# Patient Record
Sex: Male | Born: 1980 | Race: Black or African American | Hispanic: No | Marital: Married | Smoking: Never smoker
Health system: Southern US, Community
[De-identification: ages and names within clinical notes are randomized; demographics above are authoritative.]

## PROBLEM LIST (undated history)

## (undated) DIAGNOSIS — E785 Hyperlipidemia, unspecified: Secondary | ICD-10-CM

## (undated) HISTORY — DX: Hyperlipidemia, unspecified: E78.5

## (undated) HISTORY — PX: FOOT SURGERY: SHX648

## (undated) HISTORY — PX: OTHER SURGICAL HISTORY: SHX169

---

## 2010-05-23 ENCOUNTER — Ambulatory Visit: Payer: Self-pay | Admitting: Psychology

## 2011-07-18 ENCOUNTER — Other Ambulatory Visit (INDEPENDENT_AMBULATORY_CARE_PROVIDER_SITE_OTHER): Payer: BC Managed Care – PPO

## 2011-07-18 ENCOUNTER — Ambulatory Visit (INDEPENDENT_AMBULATORY_CARE_PROVIDER_SITE_OTHER): Payer: BC Managed Care – PPO | Admitting: Internal Medicine

## 2011-07-18 ENCOUNTER — Encounter: Payer: Self-pay | Admitting: Internal Medicine

## 2011-07-18 VITALS — BP 112/68 | HR 66 | Temp 97.8°F | Ht 69.0 in | Wt 173.0 lb

## 2011-07-18 DIAGNOSIS — R10819 Abdominal tenderness, unspecified site: Secondary | ICD-10-CM | POA: Insufficient documentation

## 2011-07-18 DIAGNOSIS — Z23 Encounter for immunization: Secondary | ICD-10-CM

## 2011-07-18 LAB — CBC WITH DIFFERENTIAL/PLATELET
Eosinophils Relative: 3 % (ref 0.0–5.0)
HCT: 41.1 % (ref 39.0–52.0)
Hemoglobin: 13.9 g/dL (ref 13.0–17.0)
Lymphs Abs: 1.8 10*3/uL (ref 0.7–4.0)
Monocytes Relative: 11.2 % (ref 3.0–12.0)
Neutro Abs: 2.5 10*3/uL (ref 1.4–7.7)
WBC: 5.1 10*3/uL (ref 4.5–10.5)

## 2011-07-18 LAB — COMPREHENSIVE METABOLIC PANEL
ALT: 14 U/L (ref 0–53)
AST: 16 U/L (ref 0–37)
Albumin: 4.5 g/dL (ref 3.5–5.2)
CO2: 29 mEq/L (ref 19–32)
Calcium: 9.3 mg/dL (ref 8.4–10.5)
Chloride: 104 mEq/L (ref 96–112)
GFR: 105.3 mL/min (ref >60.00)
Potassium: 3.7 mEq/L (ref 3.5–5.1)
Total Protein: 6.8 g/dL (ref 6.0–8.3)

## 2011-07-18 LAB — LIPID PANEL
Cholesterol: 165 mg/dL (ref 0–200)
HDL: 45.2 mg/dL (ref >39.00)
LDL Cholesterol: 112 mg/dL — ABNORMAL HIGH (ref 0–99)
Total CHOL/HDL Ratio: 4
Triglycerides: 38 mg/dL (ref 0.0–149.0)
VLDL: 7.6 mg/dL (ref 0.0–40.0)

## 2011-07-18 LAB — URINALYSIS, ROUTINE W REFLEX MICROSCOPIC
Bilirubin Urine: NEGATIVE
Ketones, ur: NEGATIVE
Leukocytes, UA: NEGATIVE
Specific Gravity, Urine: 1.02 (ref 1.000–1.030)
Total Protein, Urine: NEGATIVE
Urine Glucose: NEGATIVE
pH: 7.5 (ref 5.0–8.0)

## 2011-07-18 LAB — SEDIMENTATION RATE: Sed Rate: 3 mm/hr (ref 0–22)

## 2011-07-18 NOTE — Patient Instructions (Signed)
Abdominal Pain Abdominal pain can be caused by many things. Your caregiver decides the seriousness of your pain by an examination and possibly blood tests and X-rays. Many cases can be observed and treated at home. Most abdominal pain is not caused by a disease and will probably improve without treatment. However, in many cases, more time must pass before a clear cause of the pain can be found. Before that point, it may not be known if you need more testing, or if hospitalization or surgery is needed. HOME CARE INSTRUCTIONS  Do not take laxatives unless directed by your caregiver.   Take pain medicine only as directed by your caregiver.   Only take over-the-counter or prescription medicines for pain, discomfort, or fever as directed by your caregiver.   Try a clear liquid diet (broth, tea, or water) for  or as ordered by your caregiver. Slowly move to a bland diet as tolerated.  SEEK IMMEDIATE MEDICAL CARE IF:  The pain does not go away.   You or your child has an oral temperature above 100.5, not controlled by medicine.   You keep throwing up (vomiting).   The pain is felt only in portions of the abdomen. Pain in the right side could possibly be appendicitis. In an adult, pain in the left lower portion of the abdomen could be colitis or diverticulitis.   You pass bloody or black tarry stools.  MAKE SURE YOU:  Understand these instructions.   Will watch your condition.   Will get help right away if you are not doing well or get worse.  Document Released: 08/20/2005 Document Re-Released: 02/04/2010 [employer] Patient Information 2011 [employer]. 

## 2011-07-18 NOTE — Assessment & Plan Note (Signed)
He has had dull pain for 6 months and has mild ttp today, I will start the evaluation today by checking some labs

## 2011-07-18 NOTE — Progress Notes (Signed)
Subjective:    Patient ID: Kevin Frazier, male    DOB: February 06, 1981, 30 y.o.   MRN: [id number]  Abdominal Pain This is a chronic problem. Episode onset: 6 months ago. The onset quality is gradual. The problem occurs intermittently. The most recent episode lasted 6 months. The problem has been unchanged. The pain is located in the left flank. The pain is at a severity of 1/10. The pain is mild. The quality of the pain is aching and dull. The abdominal pain does not radiate. Pertinent negatives include no anorexia, arthralgias, belching, constipation, diarrhea, dysuria, fever, flatus, frequency, headaches, hematochezia, hematuria, melena, myalgias, nausea, vomiting or weight loss. The pain is aggravated by nothing. The pain is relieved by nothing. He has tried nothing for the symptoms. There is no history of abdominal surgery, colon cancer, Crohn's disease, gallstones, GERD, irritable bowel syndrome, pancreatitis, PUD or ulcerative colitis.      Review of Systems  Constitutional: Negative for fever, chills, weight loss, diaphoresis, activity change, appetite change, fatigue and unexpected weight change.  HENT: Negative.   Eyes: Negative.   Respiratory: Negative.   Cardiovascular: Negative.   Gastrointestinal: Positive for abdominal pain. Negative for nausea, vomiting, diarrhea, constipation, blood in stool, melena, hematochezia, abdominal distention, anal bleeding, rectal pain, anorexia and flatus.  Genitourinary: Positive for flank pain. Negative for dysuria, urgency, frequency, hematuria, decreased urine volume, discharge, penile swelling, scrotal swelling, enuresis, difficulty urinating, genital sores, penile pain and testicular pain.  Musculoskeletal: Negative for myalgias, back pain, joint swelling, arthralgias and gait problem.  Skin: Negative for color change, pallor, rash and wound.  Neurological: Negative.  Negative for headaches.  Hematological: Negative for adenopathy. Does not  bruise/bleed easily.  Psychiatric/Behavioral: Negative.        Objective:   Physical Exam  Vitals reviewed. Constitutional: He is oriented to person, place, and time. He appears well-developed and well-nourished. No distress.  HENT:  Head: Normocephalic and atraumatic.  Mouth/Throat: No oropharyngeal exudate.  Eyes: Conjunctivae are normal. Right eye exhibits no discharge. Left eye exhibits no discharge. No scleral icterus.  Neck: Normal range of motion. Neck supple. No JVD present. No tracheal deviation present. No thyromegaly present.  Cardiovascular: Normal rate, regular rhythm, normal heart sounds and intact distal pulses.  Exam reveals no gallop and no friction rub.   No murmur heard. Pulmonary/Chest: Effort normal and breath sounds normal. No stridor. No respiratory distress. He has no wheezes. He has no rales. He exhibits no tenderness.  Abdominal: Soft. Normal appearance and bowel sounds are normal. He exhibits no shifting dullness, no distension, no pulsatile liver, no fluid wave, no abdominal bruit, no ascites, no pulsatile midline mass and no mass. There is no hepatosplenomegaly, splenomegaly or hepatomegaly. There is tenderness (left mid-abdomen). There is no rebound, no guarding, no CVA tenderness, no tenderness at McBurney's point and negative Murphy's sign. No hernia. Hernia confirmed negative in the ventral area, confirmed negative in the right inguinal area and confirmed negative in the left inguinal area.  Genitourinary: Rectum normal, prostate normal, testes normal and penis normal. Rectal exam shows no external hemorrhoid, no internal hemorrhoid, no fissure, no mass, no tenderness and anal tone normal. Guaiac negative stool. Prostate is not enlarged and not tender. Right testis shows no mass, no swelling and no tenderness. Right testis is descended. Cremasteric reflex is not absent on the right side. Left testis shows no mass, no swelling and no tenderness. Left testis is  descended. Cremasteric reflex is not absent on the left side.  Circumcised. No penile erythema or penile tenderness. No discharge found.  Musculoskeletal: Normal range of motion. He exhibits no edema and no tenderness.  Lymphadenopathy:    He has no cervical adenopathy.       Right: No inguinal adenopathy present.       Left: No inguinal adenopathy present.  Neurological: He is oriented to person, place, and time. He has normal reflexes. He displays normal reflexes. No cranial nerve deficit. He exhibits normal muscle tone. Coordination normal.  Skin: Skin is warm and dry. No rash noted. He is not diaphoretic. No erythema. No pallor.  Psychiatric: He has a normal mood and affect. His behavior is normal. Judgment and thought content normal.          Assessment & Plan:

## 2011-07-20 ENCOUNTER — Encounter: Payer: Self-pay | Admitting: Internal Medicine

## 2013-08-26 ENCOUNTER — Encounter: Payer: Self-pay | Admitting: Internal Medicine

## 2013-08-26 ENCOUNTER — Ambulatory Visit (INDEPENDENT_AMBULATORY_CARE_PROVIDER_SITE_OTHER): Payer: 59 | Admitting: Internal Medicine

## 2013-08-26 VITALS — BP 138/82 | HR 73 | Temp 97.6°F | Resp 16 | Ht 69.0 in | Wt 187.0 lb

## 2013-08-26 DIAGNOSIS — Z0000 Encounter for general adult medical examination without abnormal findings: Secondary | ICD-10-CM

## 2013-08-26 DIAGNOSIS — Z23 Encounter for immunization: Secondary | ICD-10-CM

## 2013-08-26 NOTE — Patient Instructions (Signed)
Health Maintenance, Males A healthy lifestyle and preventative care can promote health and wellness.  Maintain regular health, dental, and eye exams.  Eat a healthy diet. Foods like vegetables, fruits, whole grains, low-fat dairy products, and lean protein foods contain the nutrients you need without too many calories. Decrease your intake of foods high in solid fats, added sugars, and salt. Get information about a proper diet from your caregiver, if necessary.  Regular physical exercise is one of the most important things you can do for your health. Most adults should get at least 150 minutes of moderate-intensity exercise (any activity that increases your heart rate and causes you to sweat) each week. In addition, most adults need muscle-strengthening exercises on 2 or more days a week.   Maintain a healthy weight. The body mass index (BMI) is a screening tool to identify possible weight problems. It provides an estimate of body fat based on height and weight. Your caregiver can help determine your BMI, and can help you achieve or maintain a healthy weight. For adults 20 years and older:  A BMI below 18.5 is considered underweight.  A BMI of 18.5 to 24.9 is normal.  A BMI of 25 to 29.9 is considered overweight.  A BMI of 30 and above is considered obese.  Maintain normal blood lipids and cholesterol by exercising and minimizing your intake of saturated fat. Eat a balanced diet with plenty of fruits and vegetables. Blood tests for lipids and cholesterol should begin at age 20 and be repeated every 5 years. If your lipid or cholesterol levels are high, you are over 50, or you are a high risk for heart disease, you may need your cholesterol levels checked more frequently.Ongoing high lipid and cholesterol levels should be treated with medicines, if diet and exercise are not effective.  If you smoke, find out from your caregiver how to quit. If you do not use tobacco, do not start.  If you  choose to drink alcohol, do not exceed 2 drinks per day. One drink is considered to be 12 ounces (355 mL) of beer, 5 ounces (148 mL) of wine, or 1.5 ounces (44 mL) of liquor.  Avoid use of street drugs. Do not share needles with anyone. Ask for help if you need support or instructions about stopping the use of drugs.  High blood pressure causes heart disease and increases the risk of stroke. Blood pressure should be checked at least every 1 to 2 years. Ongoing high blood pressure should be treated with medicines if weight loss and exercise are not effective.  If you are 45 to 32 years old, ask your caregiver if you should take aspirin to prevent heart disease.  Diabetes screening involves taking a blood sample to check your fasting blood sugar level. This should be done once every 3 years, after age 45, if you are within normal weight and without risk factors for diabetes. Testing should be considered at a younger age or be carried out more frequently if you are overweight and have at least 1 risk factor for diabetes.  Colorectal cancer can be detected and often prevented. Most routine colorectal cancer screening begins at the age of 50 and continues through age 75. However, your caregiver may recommend screening at an earlier age if you have risk factors for colon cancer. On a yearly basis, your caregiver may provide home test kits to check for hidden blood in the stool. Use of a small camera at the end of a tube,   to directly examine the colon (sigmoidoscopy or colonoscopy), can detect the earliest forms of colorectal cancer. Talk to your caregiver about this at age 50, when routine screening begins. Direct examination of the colon should be repeated every 5 to 10 years through age 75, unless early forms of pre-cancerous polyps or small growths are found.  Hepatitis C blood testing is recommended for all people born from 1945 through 1965 and any individual with known risks for hepatitis C.  Healthy  men should no longer receive prostate-specific antigen (PSA) blood tests as part of routine cancer screening. Consult with your caregiver about prostate cancer screening.  Testicular cancer screening is not recommended for adolescents or adult males who have no symptoms. Screening includes self-exam, caregiver exam, and other screening tests. Consult with your caregiver about any symptoms you have or any concerns you have about testicular cancer.  Practice safe sex. Use condoms and avoid high-risk sexual practices to reduce the spread of sexually transmitted infections (STIs).  Use sunscreen with a sun protection factor (SPF) of 30 or greater. Apply sunscreen liberally and repeatedly throughout the day. You should seek shade when your shadow is shorter than you. Protect yourself by wearing long sleeves, pants, a wide-brimmed hat, and sunglasses year round, whenever you are outdoors.  Notify your caregiver of new moles or changes in moles, especially if there is a change in shape or color. Also notify your caregiver if a mole is larger than the size of a pencil eraser.  A one-time screening for abdominal aortic aneurysm (AAA) and surgical repair of large AAAs by sound wave imaging (ultrasonography) is recommended for ages 65 to 75 years who are current or former smokers.  Stay current with your immunizations. Document Released: 05/08/2008 Document Revised: 02/02/2012 Document Reviewed: 04/07/2011 [employer] Patient Information 2014 [employer].  

## 2013-08-26 NOTE — Progress Notes (Signed)
  Subjective:    Patient ID: Kevin Frazier, male    DOB: [birth year], 32 y.o.   MRN: [id number]  HPI  He returns for a physical - he requests that he receive a booster for Tdap and MMR and that forms be completed for him to enroll at [facility]. He feels well and offers no complaints.  Review of Systems  All other systems reviewed and are negative.       Objective:   Physical Exam  Vitals reviewed. Constitutional: He is oriented to person, place, and time. He appears well-developed and well-nourished. No distress.  HENT:  Head: Normocephalic and atraumatic.  Mouth/Throat: Oropharynx is clear and moist. No oropharyngeal exudate.  Eyes: Conjunctivae are normal. Right eye exhibits no discharge. Left eye exhibits no discharge. No scleral icterus.  Neck: Normal range of motion. Neck supple. No JVD present. No tracheal deviation present. No thyromegaly present.  Cardiovascular: Normal rate, regular rhythm, normal heart sounds and intact distal pulses.  Exam reveals no gallop and no friction rub.   No murmur heard. Pulmonary/Chest: Effort normal and breath sounds normal. No stridor. No respiratory distress. He has no wheezes. He has no rales. He exhibits no tenderness.  Abdominal: Soft. Bowel sounds are normal. He exhibits no distension and no mass. There is no tenderness. There is no rebound and no guarding. Hernia confirmed negative in the right inguinal area and confirmed negative in the left inguinal area.  Genitourinary: Testes normal and penis normal. Right testis shows no mass, no swelling and no tenderness. Right testis is descended. Left testis shows no mass, no swelling and no tenderness. Left testis is descended. Circumcised. No penile erythema or penile tenderness. No discharge found.  Musculoskeletal: Normal range of motion. He exhibits no edema.  Lymphadenopathy:    He has no cervical adenopathy.       Right: No inguinal adenopathy present.       Left: No inguinal adenopathy present.   Neurological: He is oriented to person, place, and time.  Skin: Skin is warm and dry. No rash noted. He is not diaphoretic. No erythema. No pallor.  Psychiatric: He has a normal mood and affect. His behavior is normal. Judgment and thought content normal.     Lab Results  Component Value Date   WBC 5.1 07/18/2011   HGB 13.9 07/18/2011   HCT 41.1 07/18/2011   PLT 170.0 07/18/2011   GLUCOSE 104* 07/18/2011   CHOL 165 07/18/2011   TRIG 38.0 07/18/2011   HDL 45.20 07/18/2011   LDLCALC 112* 07/18/2011   ALT 14 07/18/2011   AST 16 07/18/2011   NA 139 07/18/2011   K 3.7 07/18/2011   CL 104 07/18/2011   CREATININE 0.9 07/18/2011   BUN 16 07/18/2011   CO2 29 07/18/2011   TSH 1.51 07/18/2011       Assessment & Plan:

## 2013-08-26 NOTE — Assessment & Plan Note (Signed)
Exam done Vaccines were reviewed and updated Labs ordered Form completed Pt ed material was given

## 2013-10-16 ENCOUNTER — Encounter (HOSPITAL_COMMUNITY): Payer: Self-pay | Admitting: Emergency Medicine

## 2013-10-16 ENCOUNTER — Emergency Department (HOSPITAL_COMMUNITY)
Admission: EM | Admit: 2013-10-16 | Discharge: 2013-10-16 | Disposition: A | Discharge status: Home / Self Care | Payer: 59 | Source: Home / Self Care | Attending: Emergency Medicine | Admitting: Emergency Medicine

## 2013-10-16 DIAGNOSIS — S99921A Unspecified injury of right foot, initial encounter: Secondary | ICD-10-CM

## 2013-10-16 DIAGNOSIS — S99919A Unspecified injury of unspecified ankle, initial encounter: Secondary | ICD-10-CM

## 2013-10-16 DIAGNOSIS — B351 Tinea unguium: Secondary | ICD-10-CM

## 2013-10-16 DIAGNOSIS — S8990XA Unspecified injury of unspecified lower leg, initial encounter: Secondary | ICD-10-CM

## 2013-10-16 MED ORDER — TERBINAFINE HCL 250 MG PO TABS: 250.0000 mg | ORAL_TABLET | Freq: Every day | ORAL | Status: DC

## 2013-10-16 MED ORDER — SULFAMETHOXAZOLE-TMP DS 800-160 MG PO TABS: 1.0000 | ORAL_TABLET | Freq: Two times a day (BID) | ORAL | Status: DC

## 2013-10-16 NOTE — ED Provider Notes (Signed)
Medical screening examination/treatment/procedure(s) were performed by non-physician practitioner and as supervising physician I was immediately available for consultation/collaboration.  Leslee Home, M.D.  Reuben Likes, MD 10/16/13 [phone]

## 2013-10-16 NOTE — ED Notes (Signed)
32 yr old is here with complaints Right Big toe toenail partially off x today. He states toenail was infected for 5 months and he was treating it with OTC anti-fungal medication. No other complaints.

## 2013-10-16 NOTE — ED Provider Notes (Signed)
CSN: [id number]     Arrival date & time 10/16/13  1629 History   First MD Initiated Contact with Patient 10/16/13 1829     Chief Complaint  Patient presents with  . Nail Problem   (Consider location/radiation/quality/duration/timing/severity/associated sxs/prior Treatment) HPI Comments: 32 year old male presents complaining of injury to his right great toenail. For the past 5 months, he has been treating onychomycosis with a topical antifungal nail paint daily. Today, he caught his toenail on something and it ripped the toenail up. He has some bleeding around the toenail. The toe is moderately painful as well. He denies any other symptoms or any other injuries.   History reviewed. No pertinent past medical history. History reviewed. No pertinent past surgical history. Family History  Problem Relation Age of Onset  . Cancer Neg Hx   . Diabetes Neg Hx   . Heart disease Neg Hx   . Hyperlipidemia Neg Hx   . Hypertension Neg Hx   . Kidney disease Neg Hx    History  Substance Use Topics  . Smoking status: Never Smoker   . Smokeless tobacco: Never Used  . Alcohol Use: 1.2 oz/week    2 Shots of liquor per week     Comment: occassional    Review of Systems  Constitutional: Negative for fever, chills and fatigue.  HENT: Negative for sore throat.   Eyes: Negative for visual disturbance.  Respiratory: Negative for cough and shortness of breath.   Cardiovascular: Negative for chest pain, palpitations and leg swelling.  Gastrointestinal: Negative for nausea, vomiting, abdominal pain, diarrhea and constipation.  Genitourinary: Negative for dysuria, urgency, frequency and hematuria.  Musculoskeletal:       See history of present illness  Skin: Negative for rash.  Neurological: Negative for dizziness, weakness and light-headedness.    Allergies  Review of patient's allergies indicates no known allergies.  Home Medications   Current Outpatient Rx  Name  Route  Sig  Dispense  Refill    . sulfamethoxazole-trimethoprim (BACTRIM DS) 800-160 MG per tablet   Oral   Take 1 tablet by mouth 2 (two) times daily.   14 tablet   0   . terbinafine (LAMISIL) 250 MG tablet   Oral   Take 1 tablet (250 mg total) by mouth daily.   30 tablet   2    BP 134/84  Pulse 60  Temp(Src) 97.9 F (36.6 C) (Oral)  Resp 16  SpO2 100% Physical Exam  Nursing note and vitals reviewed. Constitutional: He is oriented to person, place, and time. He appears well-developed and well-nourished. No distress.  HENT:  Head: Normocephalic.  Pulmonary/Chest: Effort normal. No respiratory distress.  Musculoskeletal:       Feet:  Neurological: He is alert and oriented to person, place, and time. Coordination normal.  Skin: Skin is warm and dry. No rash noted. He is not diaphoretic.  Psychiatric: He has a normal mood and affect. Judgment normal.    ED Course  Procedures (including critical care time) Labs Review Labs Reviewed - No data to display Imaging Review No results found.    MDM   1. Injury of toenail of right foot   2. Onychomycosis     The toenail was ripped up but it is still attached at the base, I will leave in place. Advised warm soaks 3 times a day and will start on oral Lamisil. Also provided a prescription for Bactrim if he should have increasing pain, redness, or discharge that may indicate secondary bacterial  infection. Otherwise do not take the Bactrim. Followup with your primary care provider  Meds ordered this encounter  Medications  . terbinafine (LAMISIL) 250 MG tablet    Sig: Take 1 tablet (250 mg total) by mouth daily.    Dispense:  30 tablet    Refill:  2    Order Specific Question:  Supervising Provider    Answer:  Lorenz Coaster, DAVID C [id number]  . sulfamethoxazole-trimethoprim (BACTRIM DS) 800-160 MG per tablet    Sig: Take 1 tablet by mouth 2 (two) times daily.    Dispense:  14 tablet    Refill:  0    Order Specific Question:  Supervising Provider    Answer:   Lorenz Coaster, DAVID C [[id number]]   \  Graylon Good, PA-C 10/16/13 1956

## 2013-12-29 ENCOUNTER — Ambulatory Visit (INDEPENDENT_AMBULATORY_CARE_PROVIDER_SITE_OTHER): Payer: 59 | Admitting: Internal Medicine

## 2013-12-29 ENCOUNTER — Ambulatory Visit: Payer: 59 | Admitting: Internal Medicine

## 2013-12-29 ENCOUNTER — Encounter: Payer: Self-pay | Admitting: Internal Medicine

## 2013-12-29 VITALS — BP 120/68 | HR 85 | Temp 97.0°F | Resp 16 | Ht 69.0 in

## 2013-12-29 DIAGNOSIS — B029 Zoster without complications: Secondary | ICD-10-CM | POA: Insufficient documentation

## 2013-12-29 MED ORDER — OXYCODONE-ACETAMINOPHEN 7.5-325 MG PO TABS: 1.0000 | ORAL_TABLET | ORAL | Status: DC | PRN

## 2013-12-29 MED ORDER — VALACYCLOVIR HCL 1 G PO TABS: 1000.0000 mg | ORAL_TABLET | Freq: Three times a day (TID) | ORAL | Status: DC

## 2013-12-29 NOTE — Assessment & Plan Note (Signed)
I will treat the infection with valtrex and will control the pain with percocet

## 2013-12-29 NOTE — Progress Notes (Signed)
Pre visit review using our clinic review tool, if applicable. No additional management support is needed unless otherwise documented below in the visit note. 

## 2013-12-29 NOTE — Progress Notes (Signed)
   Subjective:    Patient ID: Kevin Frazier, male    DOB: Jan 03, 1981, 33 y.o.   MRN: [id number]  Rash This is a new problem. The current episode started in the past 7 days. The problem is unchanged. The affected locations include the torso. The rash is characterized by pain, redness and blistering. He was exposed to nothing. Pertinent negatives include no anorexia, congestion, cough, diarrhea, eye pain, facial edema, fatigue, fever, joint pain, nail changes, rhinorrhea, shortness of breath, sore throat or vomiting. Past treatments include nothing. His past medical history is significant for varicella. There is no history of allergies, asthma or eczema.      Review of Systems  Constitutional: Negative for fever and fatigue.  HENT: Negative for congestion, rhinorrhea and sore throat.   Eyes: Negative for pain.  Respiratory: Negative for cough and shortness of breath.   Gastrointestinal: Negative for vomiting, diarrhea and anorexia.  Musculoskeletal: Negative for joint pain.  Skin: Positive for rash. Negative for nail changes.  All other systems reviewed and are negative.       Objective:   Physical Exam  Vitals reviewed. Constitutional: He appears well-developed and well-nourished.  Non-toxic appearance. He does not have a sickly appearance. He does not appear ill. No distress.  HENT:  Head: Normocephalic and atraumatic.  Mouth/Throat: Oropharynx is clear and moist. No oropharyngeal exudate.  Eyes: Conjunctivae are normal. Right eye exhibits no discharge. Left eye exhibits no discharge. No scleral icterus.  Neck: Normal range of motion. Neck supple. No JVD present. No tracheal deviation present. No thyromegaly present.  Cardiovascular: Normal rate, regular rhythm, normal heart sounds and intact distal pulses.  Exam reveals no gallop and no friction rub.   No murmur heard. Pulmonary/Chest: Effort normal and breath sounds normal. No stridor. No respiratory distress. He has no wheezes. He  has no rales.   He exhibits no tenderness.    Abdominal: Soft. Bowel sounds are normal. He exhibits no distension and no mass. There is no tenderness. There is no rebound and no guarding.  Lymphadenopathy:       Head (right side): No submental, no submandibular, no tonsillar, no preauricular, no posterior auricular and no occipital adenopathy present.       Head (left side): No submental, no submandibular, no tonsillar, no preauricular, no posterior auricular and no occipital adenopathy present.    He has no cervical adenopathy.       Right cervical: No superficial cervical, no deep cervical and no posterior cervical adenopathy present.      Left cervical: No superficial cervical, no deep cervical and no posterior cervical adenopathy present.    He has no axillary adenopathy.       Right: No inguinal, no supraclavicular and no epitrochlear adenopathy present.       Left: No inguinal, no supraclavicular and no epitrochlear adenopathy present.  Skin: Skin is warm and dry. Rash noted. He is not diaphoretic. There is erythema. No pallor.  Psychiatric: He has a normal mood and affect. His behavior is normal. Judgment and thought content normal.          Assessment & Plan:

## 2013-12-29 NOTE — Patient Instructions (Signed)
Shingles Shingles (herpes zoster) is an infection that is caused by the same virus that causes chickenpox (varicella). The infection causes a painful skin rash and fluid-filled blisters, which eventually break open, crust over, and heal. It may occur in any area of the body, but it usually affects only one side of the body or face. The pain of shingles usually lasts about 1 month. However, some people with shingles may develop long-term (chronic) pain in the affected area of the body. Shingles often occurs many years after the person had chickenpox. It is more common:  In people older than 50 years.  In people with weakened immune systems, such as those with HIV, AIDS, or cancer.  In people taking medicines that weaken the immune system, such as transplant medicines.  In people under great stress. CAUSES  Shingles is caused by the varicella zoster virus (VZV), which also causes chickenpox. After a person is infected with the virus, it can remain in the person's body for years in an inactive state (dormant). To cause shingles, the virus reactivates and breaks out as an infection in a nerve root. The virus can be spread from person to person (contagious) through contact with open blisters of the shingles rash. It will only spread to people who have not had chickenpox. When these people are exposed to the virus, they may develop chickenpox. They will not develop shingles. Once the blisters scab over, the person is no longer contagious and cannot spread the virus to others. SYMPTOMS  Shingles shows up in stages. The initial symptoms may be pain, itching, and tingling in an area of the skin. This pain is usually described as burning, stabbing, or throbbing.In a few days or weeks, a painful red rash will appear in the area where the pain, itching, and tingling were felt. The rash is usually on one side of the body in a band or belt-like pattern. Then, the rash usually turns into fluid-filled blisters. They  will scab over and dry up in approximately 2 3 weeks. Flu-like symptoms may also occur with the initial symptoms, the rash, or the blisters. These may include:  Fever.  Chills.  Headache.  Upset stomach. DIAGNOSIS  Your caregiver will perform a skin exam to diagnose shingles. Skin scrapings or fluid samples may also be taken from the blisters. This sample will be examined under a microscope or sent to a lab for further testing. TREATMENT  There is no specific cure for shingles. Your caregiver will likely prescribe medicines to help you manage the pain, recover faster, and avoid long-term problems. This may include antiviral drugs, anti-inflammatory drugs, and pain medicines. HOME CARE INSTRUCTIONS   Take a cool bath or apply cool compresses to the area of the rash or blisters as directed. This may help with the pain and itching.   Only take over-the-counter or prescription medicines as directed by your caregiver.   Rest as directed by your caregiver.  Keep your rash and blisters clean with mild soap and cool water or as directed by your caregiver.  Do not pick your blisters or scratch your rash. Apply an anti-itch cream or numbing creams to the affected area as directed by your caregiver.  Keep your shingles rash covered with a loose bandage (dressing).  Avoid skin contact with:  Babies.   Pregnant women.   Children with eczema.   Elderly people with transplants.   People with chronic illnesses, such as leukemia or AIDS.   Wear loose-fitting clothing to help ease   the pain of material rubbing against the rash.  Keep all follow-up appointments with your caregiver.If the area involved is on your face, you may receive a referral for follow-up to a specialist, such as an eye doctor (ophthalmologist) or an ear, nose, and throat (ENT) doctor. Keeping all follow-up appointments will help you avoid eye complications, chronic pain, or disability.  SEEK IMMEDIATE MEDICAL  CARE IF:   You have facial pain, pain around the eye area, or loss of feeling on one side of your face.  You have ear pain or ringing in your ear.  You have loss of taste.  Your pain is not relieved with prescribed medicines.   Your redness or swelling spreads.   You have more pain and swelling.  Your condition is worsening or has changed.   You have a feveror persistent symptoms for more than 2 3 days.  You have a fever and your symptoms suddenly get worse. MAKE SURE YOU:  Understand these instructions.  Will watch your condition.  Will get help right away if you are not doing well or get worse. Document Released: 11/10/2005 Document Revised: 08/04/2012 Document Reviewed: 06/24/2012 [employer] Patient Information 2014 [employer].  

## 2015-12-05 ENCOUNTER — Encounter: Payer: Self-pay | Admitting: Internal Medicine

## 2015-12-05 ENCOUNTER — Ambulatory Visit (INDEPENDENT_AMBULATORY_CARE_PROVIDER_SITE_OTHER): Payer: 59 | Admitting: Internal Medicine

## 2015-12-05 ENCOUNTER — Other Ambulatory Visit: Payer: 59

## 2015-12-05 ENCOUNTER — Other Ambulatory Visit (INDEPENDENT_AMBULATORY_CARE_PROVIDER_SITE_OTHER): Payer: 59

## 2015-12-05 VITALS — BP 100/64 | HR 75 | Temp 98.1°F | Ht 70.0 in | Wt 180.0 lb

## 2015-12-05 DIAGNOSIS — R1084 Generalized abdominal pain: Secondary | ICD-10-CM | POA: Diagnosis not present

## 2015-12-05 DIAGNOSIS — R197 Diarrhea, unspecified: Secondary | ICD-10-CM

## 2015-12-05 DIAGNOSIS — R109 Unspecified abdominal pain: Secondary | ICD-10-CM | POA: Insufficient documentation

## 2015-12-05 LAB — BASIC METABOLIC PANEL
BUN: 15 mg/dL (ref 6–23)
CHLORIDE: 104 meq/L (ref 96–112)
CO2: 26 mEq/L (ref 19–32)
Calcium: 9.3 mg/dL (ref 8.4–10.5)
Creatinine, Ser: 0.82 mg/dL (ref 0.40–1.50)
GFR: 114.05 mL/min (ref >60.00)
Glucose, Bld: 80 mg/dL (ref 70–99)
POTASSIUM: 4.1 meq/L (ref 3.5–5.1)
Sodium: 137 mEq/L (ref 135–145)

## 2015-12-05 LAB — CBC WITH DIFFERENTIAL/PLATELET
BASOS PCT: 0.2 % (ref 0.0–3.0)
Basophils Absolute: 0 10*3/uL (ref 0.0–0.1)
EOS PCT: 4.8 % (ref 0.0–5.0)
Eosinophils Absolute: 0.3 10*3/uL (ref 0.0–0.7)
HEMATOCRIT: 45.6 % (ref 39.0–52.0)
HEMOGLOBIN: 15.2 g/dL (ref 13.0–17.0)
Lymphocytes Relative: 19.5 % (ref 12.0–46.0)
Lymphs Abs: 1.4 10*3/uL (ref 0.7–4.0)
MCHC: 33.4 g/dL (ref 30.0–36.0)
MCV: 87.8 fl (ref 78.0–100.0)
Monocytes Absolute: 1.1 10*3/uL — ABNORMAL HIGH (ref 0.1–1.0)
Monocytes Relative: 15.7 % — ABNORMAL HIGH (ref 3.0–12.0)
NEUTROS ABS: 4.3 10*3/uL (ref 1.4–7.7)
Neutrophils Relative %: 59.8 % (ref 43.0–77.0)
PLATELETS: 228 10*3/uL (ref 150.0–400.0)
RBC: 5.19 Mil/uL (ref 4.22–5.81)
RDW: 12.2 % (ref 11.5–15.5)
WBC: 7.2 10*3/uL (ref 4.0–10.5)

## 2015-12-05 LAB — URINALYSIS, ROUTINE W REFLEX MICROSCOPIC
Bilirubin Urine: NEGATIVE
Hgb urine dipstick: NEGATIVE
Ketones, ur: 15 — AB
Leukocytes, UA: NEGATIVE
Nitrite: NEGATIVE
PH: 6 (ref 5.0–8.0)
RBC / HPF: NONE SEEN (ref 0–?)
SPECIFIC GRAVITY, URINE: 1.015 (ref 1.000–1.030)
Total Protein, Urine: NEGATIVE
URINE GLUCOSE: NEGATIVE
UROBILINOGEN UA: 0.2 (ref 0.0–1.0)

## 2015-12-05 LAB — HEPATIC FUNCTION PANEL
ALT: 20 U/L (ref 0–53)
AST: 17 U/L (ref 0–37)
Albumin: 4.5 g/dL (ref 3.5–5.2)
Alkaline Phosphatase: 28 U/L — ABNORMAL LOW (ref 39–117)
BILIRUBIN DIRECT: 0.2 mg/dL (ref 0.0–0.3)
BILIRUBIN TOTAL: 1 mg/dL (ref 0.2–1.2)
Total Protein: 6.8 g/dL (ref 6.0–8.3)

## 2015-12-05 LAB — LIPASE: Lipase: 7 U/L — ABNORMAL LOW (ref 11.0–59.0)

## 2015-12-05 MED ORDER — METRONIDAZOLE 500 MG PO TABS: 500.0000 mg | ORAL_TABLET | Freq: Three times a day (TID) | ORAL | Status: DC

## 2015-12-05 MED ORDER — CIPROFLOXACIN HCL 500 MG PO TABS: 500.0000 mg | ORAL_TABLET | Freq: Two times a day (BID) | ORAL | Status: DC

## 2015-12-05 NOTE — Progress Notes (Signed)
Pre visit review using our clinic review tool, if applicable. No additional management support is needed unless otherwise documented below in the visit note. 

## 2015-12-05 NOTE — Progress Notes (Signed)
   Subjective:    Patient ID: Kevin Frazier, male    DOB: 11-13-1981, 35 y.o.   MRN: [id number]  HPI  Here with acute onset diarrheal illness, started 1 wk ago, seemed some improved a few days later, but last night started up again with mult liquid stools; wife with same diet but not ill.  [place] - 1 wk from dec to [place]; No other travel.or pets. No fever last night, but did have fever for first 2 days. Diarrhea severe today, though only drinking fluids, water and juice. Pain mod to severe, mostly mid abd possbily worse LLQ; No vomiting or nausea. Lots of gurgling sounds.  No blood. Pain without radation No prior hx.or hx of IBD or similar. Works at [employer], but not eaten there in the past wk. No c diff exposure known.  Stool smelled foul today No past medical history on file. No past surgical history on file.  reports that he has never smoked. He has never used smokeless tobacco. He reports that he drinks about 1.2 oz of alcohol per week. He reports that he does not use illicit drugs. family history is negative for Cancer, Diabetes, Heart disease, Hyperlipidemia, Hypertension, and Kidney disease. No Known Allergies Current Outpatient Prescriptions on File Prior to Visit  Medication Sig Dispense Refill  . oxyCODONE-acetaminophen (PERCOCET) 7.5-325 MG per tablet Take 1 tablet by mouth every 4 (four) hours as needed for pain. (Patient not taking: Reported on 12/05/2015) 30 tablet 0  . valACYclovir (VALTREX) 1000 MG tablet Take 1 tablet (1,000 mg total) by mouth 3 (three) times daily. (Patient not taking: Reported on 12/05/2015) 30 tablet 1   No current facility-administered medications on file prior to visit.    Review of Systems  Constitutional: Negative for unusual diaphoresis or night sweats HENT: Negative for ringing in ear or discharge Eyes: Negative for double vision or worsening visual disturbance.  Respiratory: Negative for choking and stridor.     Gastrointestinal: Negative for vomiting or other signifcant bowel change Genitourinary: Negative for hematuria or change in urine volume.  Musculoskeletal: Negative for other MSK pain or swelling Skin: Negative for color change and worsening wound.  Neurological: Negative for tremors and numbness other than noted  Psychiatric/Behavioral: Negative for decreased concentration or agitation other than above       Objective:   Physical Exam BP 100/64 mmHg  Pulse 75  Temp(Src) 98.1 F (36.7 C) (Oral)  Ht 5\' 10"  (1.778 m)  Wt 180 lb (81.647 kg)  BMI 25.83 kg/m2  SpO2 98% VS noted, mild ill Constitutional: Pt appears in no significant distress HENT: Head: NCAT.  Right Ear: External ear normal.  Left Ear: External ear normal.  Eyes: . Pupils are equal, round, and reactive to light. Conjunctivae and EOM are normal Bilat tm's with mild erythema.  Max sinus areas non tender.  Pharynx with mild erythema, no exudate Neck: Normal range of motion. Neck supple.  Cardiovascular: Normal rate and regular rhythm.   Pulmonary/Chest: Effort normal and breath sounds without rales or wheezing.  Abd:  Soft, ND, + BS, diffuse mild tender, no guarding or rebound Neurological: Pt is alert. Not confused , motor grossly intact Skin: Skin is warm. No rash, no LE edema Psychiatric: Pt behavior is normal. No agitation.     Assessment & Plan:

## 2015-12-05 NOTE — Patient Instructions (Addendum)
Please take all new medication as prescribed  - the antibiotics  Please continue all other medications as before, and refills have been done if requested.  Please have the pharmacy call with any other refills you may need.  Please keep your appointments with your specialists as you may have planned  Please go to the LAB in the Basement (turn left off the elevator) for the tests to be done today (including the stool testing)  You will be contacted by phone if any changes need to be made immediately.  Otherwise, you will receive a letter about your results with an explanation, but please check with MyChart first.  You are given the work note today

## 2015-12-06 LAB — CLOSTRIDIUM DIFFICILE BY PCR: Toxigenic C. Difficile by PCR: NOT DETECTED

## 2015-12-08 NOTE — Assessment & Plan Note (Addendum)
C/w colitis, ? Viral vs other, cant r/o bact/c diff, for labs as ordered,  Stool studies, empiric antibx,  to f/u any worsening symptoms or concerns

## 2015-12-08 NOTE — Assessment & Plan Note (Signed)
Related to above, for labs as ordered

## 2015-12-09 LAB — STOOL CULTURE

## 2016-08-27 ENCOUNTER — Ambulatory Visit (INDEPENDENT_AMBULATORY_CARE_PROVIDER_SITE_OTHER): Payer: 59 | Admitting: Internal Medicine

## 2016-08-27 ENCOUNTER — Other Ambulatory Visit (INDEPENDENT_AMBULATORY_CARE_PROVIDER_SITE_OTHER): Payer: 59

## 2016-08-27 ENCOUNTER — Ambulatory Visit (INDEPENDENT_AMBULATORY_CARE_PROVIDER_SITE_OTHER)
Admission: RE | Admit: 2016-08-27 | Discharge: 2016-08-27 | Disposition: A | Discharge status: Home / Self Care | Payer: 59 | Source: Ambulatory Visit | Attending: Internal Medicine | Admitting: Internal Medicine

## 2016-08-27 ENCOUNTER — Encounter: Payer: Self-pay | Admitting: Internal Medicine

## 2016-08-27 VITALS — BP 122/72 | HR 74 | Temp 98.2°F | Resp 20 | Wt 181.2 lb

## 2016-08-27 DIAGNOSIS — R079 Chest pain, unspecified: Secondary | ICD-10-CM

## 2016-08-27 DIAGNOSIS — R1012 Left upper quadrant pain: Secondary | ICD-10-CM

## 2016-08-27 DIAGNOSIS — R197 Diarrhea, unspecified: Secondary | ICD-10-CM

## 2016-08-27 DIAGNOSIS — K6289 Other specified diseases of anus and rectum: Secondary | ICD-10-CM | POA: Diagnosis not present

## 2016-08-27 DIAGNOSIS — Z0001 Encounter for general adult medical examination with abnormal findings: Secondary | ICD-10-CM | POA: Diagnosis not present

## 2016-08-27 LAB — URINALYSIS, ROUTINE W REFLEX MICROSCOPIC
BILIRUBIN URINE: NEGATIVE
Hgb urine dipstick: NEGATIVE
Ketones, ur: NEGATIVE
LEUKOCYTES UA: NEGATIVE
Nitrite: NEGATIVE
Specific Gravity, Urine: <=1.005 — AB (ref 1.000–1.030)
TOTAL PROTEIN, URINE-UPE24: NEGATIVE
Urine Glucose: NEGATIVE
Urobilinogen, UA: 0.2 (ref 0.0–1.0)
pH: 7 (ref 5.0–8.0)

## 2016-08-27 LAB — LIPID PANEL
CHOLESTEROL: 188 mg/dL (ref 0–200)
HDL: 48.9 mg/dL (ref >39.00)
LDL Cholesterol: 123 mg/dL — ABNORMAL HIGH (ref 0–99)
NonHDL: 139.51
Total CHOL/HDL Ratio: 4
Triglycerides: 85 mg/dL (ref 0.0–149.0)
VLDL: 17 mg/dL (ref 0.0–40.0)

## 2016-08-27 LAB — BASIC METABOLIC PANEL
BUN: 8 mg/dL (ref 6–23)
CALCIUM: 9.5 mg/dL (ref 8.4–10.5)
CHLORIDE: 103 meq/L (ref 96–112)
CO2: 29 meq/L (ref 19–32)
CREATININE: 0.82 mg/dL (ref 0.40–1.50)
GFR: 113.56 mL/min (ref >60.00)
GLUCOSE: 95 mg/dL (ref 70–99)
Potassium: 4 mEq/L (ref 3.5–5.1)
SODIUM: 139 meq/L (ref 135–145)

## 2016-08-27 LAB — CBC WITH DIFFERENTIAL/PLATELET
BASOS ABS: 0 10*3/uL (ref 0.0–0.1)
Basophils Relative: 0.5 % (ref 0.0–3.0)
Eosinophils Absolute: 0.2 10*3/uL (ref 0.0–0.7)
Eosinophils Relative: 2.6 % (ref 0.0–5.0)
HCT: 40 % (ref 39.0–52.0)
Hemoglobin: 13.9 g/dL (ref 13.0–17.0)
Lymphocytes Relative: 37.2 % (ref 12.0–46.0)
Lymphs Abs: 2.4 10*3/uL (ref 0.7–4.0)
MCHC: 34.8 g/dL (ref 30.0–36.0)
MCV: 87.6 fl (ref 78.0–100.0)
MONOS PCT: 11.2 % (ref 3.0–12.0)
Monocytes Absolute: 0.7 10*3/uL (ref 0.1–1.0)
NEUTROS PCT: 48.5 % (ref 43.0–77.0)
Neutro Abs: 3.2 10*3/uL (ref 1.4–7.7)
Platelets: 211 10*3/uL (ref 150.0–400.0)
RBC: 4.56 Mil/uL (ref 4.22–5.81)
RDW: 12.4 % (ref 11.5–15.5)
WBC: 6.6 10*3/uL (ref 4.0–10.5)

## 2016-08-27 LAB — HEPATIC FUNCTION PANEL
ALBUMIN: 4.4 g/dL (ref 3.5–5.2)
ALK PHOS: 24 U/L — AB (ref 39–117)
ALT: 13 U/L (ref 0–53)
AST: 13 U/L (ref 0–37)
Bilirubin, Direct: 0.1 mg/dL (ref 0.0–0.3)
TOTAL PROTEIN: 6.9 g/dL (ref 6.0–8.3)
Total Bilirubin: 0.8 mg/dL (ref 0.2–1.2)

## 2016-08-27 LAB — TSH: TSH: 1.65 u[IU]/mL (ref 0.35–4.50)

## 2016-08-27 NOTE — Patient Instructions (Addendum)
Please continue all other medications as before, and refills have been done if requested.  Please have the pharmacy call with any other refills you may need.  Please continue your efforts at being more active, low cholesterol diet, and weight control.  You are otherwise up to date with prevention measures today.  Please keep your appointments with your specialists as you may have planned  You will be contacted regarding the referral for: Gastroenterology  Please go to the XRAY Department in the Basement (go straight as you get off the elevator) for the x-ray testing  Please go to the LAB in the Basement (turn left off the elevator) for the tests to be done today  You will be contacted by phone if any changes need to be made immediately.  Otherwise, you will receive a letter about your results with an explanation, but please check with MyChart first.  Please remember to sign up for MyChart if you have not done so, as this will be important to you in the future with finding out test results, communicating by private email, and scheduling acute appointments online when needed.  Please return in 1 year for your yearly visit, or sooner if needed, with Lab testing done 3-5 days before

## 2016-08-27 NOTE — Progress Notes (Signed)
Subjective:    Patient ID: Kevin Frazier, male    DOB: Apr 13, 1981, 35 y.o.   MRN: [id number]  HPI  Here for wellness and f/u;  Overall doing ok;  Pt denies worsening SOB, DOE, wheezing, orthopnea, PND, worsening LE edema, palpitations, dizziness or syncope.  Pt denies neurological change such as new headache, facial or extremity weakness.  Pt denies polydipsia, polyuria, or low sugar symptoms. Pt states overall good compliance with treatment and medications, good tolerability, and has been trying to follow appropriate diet.  Pt denies worsening depressive symptoms, suicidal ideation or panic. No fever, night sweats, wt loss, loss of appetite, or other constitutional symptoms.  Pt states good ability with ADL's, has low fall risk, home safety reviewed and adequate, no other significant changes in hearing or vision, and only occasionally active with exercise. To get flu shot next wk at work.   Was seen for diarrhea earlier this yr, but still has some "irritated anal" and funny feeling to LUQ, not really a "pain". No further diarrhea or other significant pain. No wt loss, Appetite ok.  Denies worsening reflux, dysphagia, n/v, other bowel change or blood and no fever. Asks for Gi referral Wt Readings from Last 3 Encounters:  08/27/16 181 lb 4 oz (82.2 kg)  12/05/15 180 lb (81.6 kg)  08/26/13 187 lb (84.8 kg)  Denies urinary symptoms such as dysuria, frequency, urgency, flank pain, hematuria or n/v, fever, chills.  Has also left sided sharp intermittent fleeting pains for several days, without assoc symptoms or radiation. No past medical history on file. No past surgical history on file.  reports that he has never smoked. He has never used smokeless tobacco. He reports that he drinks about 1.2 oz of alcohol per week . He reports that he does not use drugs. family history is not on file. No Known Allergies Current Outpatient Prescriptions on File Prior to Visit  Medication Sig Dispense Refill  .  ciprofloxacin (CIPRO) 500 MG tablet Take 1 tablet (500 mg total) by mouth 2 (two) times daily. 20 tablet 0  . metroNIDAZOLE (FLAGYL) 500 MG tablet Take 1 tablet (500 mg total) by mouth 3 (three) times daily. 30 tablet 0  . oxyCODONE-acetaminophen (PERCOCET) 7.5-325 MG per tablet Take 1 tablet by mouth every 4 (four) hours as needed for pain. 30 tablet 0  . valACYclovir (VALTREX) 1000 MG tablet Take 1 tablet (1,000 mg total) by mouth 3 (three) times daily. 30 tablet 1   No current facility-administered medications on file prior to visit.      Review of Systems Constitutional: Negative for increased diaphoresis, or other activity, appetite or siginficant weight change other than noted HENT: Negative for worsening hearing loss, ear pain, facial swelling, mouth sores and neck stiffness.   Eyes: Negative for other worsening pain, redness or visual disturbance.  Respiratory: Negative for choking or stridor Cardiovascular: Negative for other chest pain and palpitations.  Gastrointestinal: Negative for worsening diarrhea, blood in stool, or abdominal distention Genitourinary: Negative for hematuria, flank pain or change in urine volume.  Musculoskeletal: Negative for myalgias or other joint complaints.  Skin: Negative for other color change and wound or drainage.  Neurological: Negative for syncope and numbness. other than noted Hematological: Negative for adenopathy. or other swelling Psychiatric/Behavioral: Negative for hallucinations, SI, self-injury, decreased concentration or other worsening agitation.      Objective:   Physical Exam BP 122/72   Pulse 74   Temp 98.2 F (36.8 C) (Oral)   Resp  20   Wt 181 lb 4 oz (82.2 kg)   SpO2 98%   BMI 26.01 kg/m  VS noted,  Constitutional: Pt is oriented to person, place, and time. Appears well-developed and well-nourished, in no significant distress Head: Normocephalic and atraumatic  Eyes: Conjunctivae and EOM are normal. Pupils are equal,  round, and reactive to light Right Ear: External ear normal.  Left Ear: External ear normal Nose: Nose normal.  Mouth/Throat: Oropharynx is clear and moist  Neck: Normal range of motion. Neck supple. No JVD present. No tracheal deviation present or significant neck LA or mass Cardiovascular: Normal rate, regular rhythm, normal heart sounds and intact distal pulses.   Pulmonary/Chest: Effort normal and breath sounds without rales or wheezing  Abdominal: Soft. Bowel sounds are normal. NT. No HSM  Musculoskeletal: Normal range of motion. Exhibits no edema but has tender bilat costal margins, also tender area left lateral chest wall anterior axillary line t5 Lymphadenopathy: Has no cervical adenopathy.  Neurological: Pt is alert and oriented to person, place, and time. Pt has normal reflexes. No cranial nerve deficit. Motor grossly intact Skin: Skin is warm and dry. No rash noted or new ulcers Psychiatric:  Has normal mood and affect. Behavior is normal.     Assessment & Plan:

## 2016-08-27 NOTE — Progress Notes (Signed)
Pre visit review using our clinic review tool, if applicable. No additional management support is needed unless otherwise documented below in the visit note. 

## 2016-08-30 DIAGNOSIS — R079 Chest pain, unspecified: Secondary | ICD-10-CM | POA: Insufficient documentation

## 2016-08-30 DIAGNOSIS — K6289 Other specified diseases of anus and rectum: Secondary | ICD-10-CM | POA: Insufficient documentation

## 2016-08-30 NOTE — Assessment & Plan Note (Signed)

## 2016-08-30 NOTE — Assessment & Plan Note (Addendum)
C/w probable msk pain, for cxr r/o other, to f/u any worsening symptoms or concerns, very low suspicion for cardiac

## 2016-08-30 NOTE — Assessment & Plan Note (Signed)
Post recent diarrheal illness, exam benign but discomfort persists, for labs, f/u with GI per pt request

## 2016-08-30 NOTE — Assessment & Plan Note (Signed)
Resolved,  to f/u any worsening symptoms or concerns  

## 2016-08-30 NOTE — Assessment & Plan Note (Addendum)
Mild, for GI referral per pt request,  to f/u any worsening symptoms or concerns  In addition to the time spent performing CPE, I spent an additional 15 minutes face to face,in which greater than 50% of this time was spent in counseling and coordination of care for patient's illness as documented.

## 2016-09-16 ENCOUNTER — Encounter: Payer: Self-pay | Admitting: Gastroenterology

## 2016-11-13 ENCOUNTER — Ambulatory Visit (INDEPENDENT_AMBULATORY_CARE_PROVIDER_SITE_OTHER): Payer: 59 | Admitting: Gastroenterology

## 2016-11-13 ENCOUNTER — Encounter: Payer: Self-pay | Admitting: Gastroenterology

## 2016-11-13 ENCOUNTER — Other Ambulatory Visit: Payer: 59

## 2016-11-13 VITALS — BP 98/70 | HR 80 | Ht 69.0 in | Wt 186.2 lb

## 2016-11-13 DIAGNOSIS — K6289 Other specified diseases of anus and rectum: Secondary | ICD-10-CM | POA: Diagnosis not present

## 2016-11-13 DIAGNOSIS — R1012 Left upper quadrant pain: Secondary | ICD-10-CM

## 2016-11-13 MED ORDER — NONFORMULARY OR COMPOUNDED ITEM: 1 refills | Status: DC

## 2016-11-13 MED ORDER — OMEPRAZOLE 40 MG PO CPDR: 40.0000 mg | DELAYED_RELEASE_CAPSULE | Freq: Every day | ORAL | 3 refills | Status: DC

## 2016-11-13 NOTE — Progress Notes (Signed)
HPI :  35 y/o healthy male with no significant past medical history, here for symptoms of abdominal pain and perianal discomfort.   He reports some discomfort in his left abdomen, describes as a pressure symptom. He reports ongoing for a year or so. Discomfort comes and goes, is not constant. He reports he will feel it every day. He thinks symptoms last a few hours and goes away on its own. Symptoms usually always preceeded by eating something. He thinks a within a half an hour he will feel it. No nausea or vomiting. No nocturnal symptoms. No weight loss. He denies any reflux symptoms. No dysphagia. He denies any routine NSAID use. He reports he works out routinely, lifts weights does abdominal exercises which does not appear to make his symptoms worse. No prior upper endoscopy or abdominal imaging. He had a normal CXR recently.   He also has had some anal discomfort, also ongoing around a year. He reports perhaps once per week he will feel it. Discomfort can last for 10-20 minutes and then resolves on its own. It is not related to bowel movements. He reports about one BM per day. He denies constipation or straining, no loose stools. No blood in the stools. He denies nocturnal symptoms.   No prior colonoscopy. No FH of colon cancer. Aunt had pancreatic cancer.     History reviewed. No pertinent past medical history. Patient reports he is in good health.    Past Surgical History:  Procedure Laterality Date  . none     Family History  Problem Relation Age of Onset  . Pancreatic cancer Paternal Aunt   . Diabetes Neg Hx   . Heart disease Neg Hx   . Hyperlipidemia Neg Hx   . Hypertension Neg Hx   . Kidney disease Neg Hx   . Colon cancer Neg Hx   . Stomach cancer Neg Hx   . Esophageal cancer Neg Hx   . Rectal cancer Neg Hx   . Liver cancer Neg Hx    Social History  Substance Use Topics  . Smoking status: Never Smoker  . Smokeless tobacco: Never Used  . Alcohol use 1.2 oz/week    2  Shots of liquor per week     Comment: occassional   No current outpatient prescriptions on file.   No current facility-administered medications for this visit.    No Known Allergies   Review of Systems: All systems reviewed and negative except where noted in HPI.   Lab Results  Component Value Date   WBC 6.6 08/27/2016   HGB 13.9 08/27/2016   HCT 40.0 08/27/2016   MCV 87.6 08/27/2016   PLT 211.0 08/27/2016    Lab Results  Component Value Date   ALT 13 08/27/2016   AST 13 08/27/2016   ALKPHOS 24 (L) 08/27/2016   BILITOT 0.8 08/27/2016    Lab Results  Component Value Date   CREATININE 0.82 08/27/2016   BUN 8 08/27/2016   NA 139 08/27/2016   K 4.0 08/27/2016   CL 103 08/27/2016   CO2 29 08/27/2016     Physical Exam: BP 98/70   Pulse 80   Ht 5\' 9"  (1.753 m)   Wt 186 lb 4 oz (84.5 kg)   BMI 27.50 kg/m  Constitutional: Pleasant,well-developed, male in no acute distress. HEENT: Normocephalic and atraumatic. Conjunctivae are normal. No scleral icterus. Neck supple.  Cardiovascular: Normal rate, regular rhythm.  Pulmonary/chest: Effort normal and breath sounds normal. No wheezing, rales or  rhonchi. Abdominal: Soft, nondistended, mild LUQ TTP along costal margin. There are no masses palpable. No hepatomegaly. DRE / Anoscopy - normal external perianal exam, high resting rectal tone, no mass on DRE. Anoscopy shows internal hemorrhoids. No fissure appreciated.  Extremities: no edema Lymphadenopathy: No cervical adenopathy noted. Neurological: Alert and oriented to person place and time. Skin: Skin is warm and dry. No rashes noted. Psychiatric: Normal mood and affect. Behavior is normal.   ASSESSMENT AND PLAN: 35 year old male without any significant past medical history here for symptoms of left upper quadrant discomfort and anal discomfort.  LUQ pain - appears to be related to the prandial setting. Otherwise no alarm symptoms or anemia. Discussed differential with  him. We'll send stool study for H. pylori antigen if this is positive we'll treat. After he estimated the stool sample asked him to have an empiric trial of omeprazole 40 mg once daily. If his symptoms persist despite trial of PPI and H. pylori negative I asked him to contact me and we will consider upper endoscopy and / or cross-sectional imaging. He agreed with the plan.  Anal discomfort - sporadic and not related to bowel movements, no blood in the stools or other alarm symptoms. He has high resting anal tone on rectal exam, internal hemorrhoids noted on anoscopy however no mass lesion appreciated. Suspect he could have symptoms of anal spasm, seems less likely to be hemorrhoids. We'll try him on a course of nitroglycerin 0.125% ointment applied 3 times daily for a few weeks. If his symptoms persist despite this then I asked him to contact me and I this setting we'll proceed with flexible sigmoidoscopy to further evaluate. He agreed.   Ileene PatrickSteven Armbruster, MD [facility] Pager [phone]  CC: Corwin LevinsJohn, James W, MD

## 2016-11-13 NOTE — Patient Instructions (Addendum)
If you are age 35 or older, your body mass index should be between 23-30. Your Body mass index is 27.5 kg/m. If this is out of the aforementioned range listed, please consider follow up with your Primary Care Provider.  If you are age 35 or younger, your body mass index should be between 19-25. Your Body mass index is 27.5 kg/m. If this is out of the aformentioned range listed, please consider follow up with your Primary Care Provider.   We have sent the following medications to your pharmacy for you to pick up at your convenience:  Nitroglycerin ointment  Omeprazole  Your physician has requested that you go to the basement for the following lab work before leaving today:  H. Pylori Stool Testing- Submit this test before taking Omeprazole. It will interfere with the results.  Please call Dr Adela LankArmbruster in a few weeks in not better.  Thank you.

## 2016-11-15 LAB — H. PYLORI ANTIGEN, STOOL: H pylori Ag, Stl: NEGATIVE

## 2016-11-18 ENCOUNTER — Telehealth: Payer: Self-pay | Admitting: Gastroenterology

## 2016-11-18 NOTE — Telephone Encounter (Signed)
Pt has been notified and will call back in a few weeks to update on condition

## 2018-04-30 ENCOUNTER — Ambulatory Visit (INDEPENDENT_AMBULATORY_CARE_PROVIDER_SITE_OTHER): Payer: BLUE CROSS/BLUE SHIELD | Admitting: Internal Medicine

## 2018-04-30 ENCOUNTER — Other Ambulatory Visit (INDEPENDENT_AMBULATORY_CARE_PROVIDER_SITE_OTHER): Payer: BLUE CROSS/BLUE SHIELD

## 2018-04-30 ENCOUNTER — Encounter: Payer: Self-pay | Admitting: Internal Medicine

## 2018-04-30 VITALS — BP 120/80 | HR 51 | Ht 69.0 in | Wt 189.0 lb

## 2018-04-30 DIAGNOSIS — Z0001 Encounter for general adult medical examination with abnormal findings: Secondary | ICD-10-CM | POA: Diagnosis not present

## 2018-04-30 DIAGNOSIS — R1012 Left upper quadrant pain: Secondary | ICD-10-CM

## 2018-04-30 DIAGNOSIS — Z0000 Encounter for general adult medical examination without abnormal findings: Secondary | ICD-10-CM | POA: Diagnosis not present

## 2018-04-30 LAB — CBC WITH DIFFERENTIAL/PLATELET
BASOS ABS: 0 10*3/uL (ref 0.0–0.1)
Basophils Relative: 0.7 % (ref 0.0–3.0)
EOS ABS: 0.1 10*3/uL (ref 0.0–0.7)
Eosinophils Relative: 2.6 % (ref 0.0–5.0)
HCT: 40.9 % (ref 39.0–52.0)
Hemoglobin: 14.1 g/dL (ref 13.0–17.0)
LYMPHS ABS: 2.1 10*3/uL (ref 0.7–4.0)
Lymphocytes Relative: 41.2 % (ref 12.0–46.0)
MCHC: 34.6 g/dL (ref 30.0–36.0)
MCV: 88 fl (ref 78.0–100.0)
MONO ABS: 0.7 10*3/uL (ref 0.1–1.0)
MONOS PCT: 13.3 % — AB (ref 3.0–12.0)
NEUTROS ABS: 2.2 10*3/uL (ref 1.4–7.7)
NEUTROS PCT: 42.2 % — AB (ref 43.0–77.0)
PLATELETS: 189 10*3/uL (ref 150.0–400.0)
RBC: 4.64 Mil/uL (ref 4.22–5.81)
RDW: 12.1 % (ref 11.5–15.5)
WBC: 5.2 10*3/uL (ref 4.0–10.5)

## 2018-04-30 LAB — BASIC METABOLIC PANEL
BUN: 15 mg/dL (ref 6–23)
CHLORIDE: 105 meq/L (ref 96–112)
CO2: 26 meq/L (ref 19–32)
Calcium: 9.7 mg/dL (ref 8.4–10.5)
Creatinine, Ser: 0.88 mg/dL (ref 0.40–1.50)
GFR: 103.69 mL/min (ref >60.00)
Glucose, Bld: 97 mg/dL (ref 70–99)
Potassium: 4 mEq/L (ref 3.5–5.1)
Sodium: 140 mEq/L (ref 135–145)

## 2018-04-30 LAB — URINALYSIS, ROUTINE W REFLEX MICROSCOPIC
Bilirubin Urine: NEGATIVE
HGB URINE DIPSTICK: NEGATIVE
KETONES UR: NEGATIVE
LEUKOCYTES UA: NEGATIVE
NITRITE: NEGATIVE
RBC / HPF: NONE SEEN (ref 0–?)
Specific Gravity, Urine: <=1.005 — AB (ref 1.000–1.030)
Total Protein, Urine: NEGATIVE
URINE GLUCOSE: NEGATIVE
UROBILINOGEN UA: 0.2 (ref 0.0–1.0)
WBC UA: NONE SEEN (ref 0–?)
pH: 7 (ref 5.0–8.0)

## 2018-04-30 LAB — LIPID PANEL
CHOL/HDL RATIO: 5
Cholesterol: 167 mg/dL (ref 0–200)
HDL: 33.9 mg/dL — AB (ref >39.00)
LDL CALC: 119 mg/dL — AB (ref 0–99)
NonHDL: 132.84
TRIGLYCERIDES: 70 mg/dL (ref 0.0–149.0)
VLDL: 14 mg/dL (ref 0.0–40.0)

## 2018-04-30 LAB — TSH: TSH: 1.76 u[IU]/mL (ref 0.35–4.50)

## 2018-04-30 LAB — HEPATIC FUNCTION PANEL
ALBUMIN: 4.4 g/dL (ref 3.5–5.2)
ALK PHOS: 26 U/L — AB (ref 39–117)
ALT: 12 U/L (ref 0–53)
AST: 13 U/L (ref 0–37)
Bilirubin, Direct: 0.1 mg/dL (ref 0.0–0.3)
Total Bilirubin: 0.4 mg/dL (ref 0.2–1.2)
Total Protein: 6.8 g/dL (ref 6.0–8.3)

## 2018-04-30 LAB — LIPASE: Lipase: 10 U/L — ABNORMAL LOW (ref 11.0–59.0)

## 2018-04-30 NOTE — Patient Instructions (Signed)
Please continue all other medications as before, and refills have been done if requested.  Please have the pharmacy call with any other refills you may need.  Please continue your efforts at being more active, low cholesterol diet, and weight control.  You are otherwise up to date with prevention measures today.  Please keep your appointments with your specialists as you may have planned  You will be contacted regarding the referral for: Gastroenterology  Please go to the LAB in the Basement (turn left off the elevator) for the tests to be done today  You will be contacted by phone if any changes need to be made immediately.  Otherwise, you will receive a letter about your results with an explanation, but please check with MyChart first.  Please remember to sign up for MyChart if you have not done so, as this will be important to you in the future with finding out test results, communicating by private email, and scheduling acute appointments online when needed.  Please return in 1 - 2 years or sooner if needed

## 2018-04-30 NOTE — Progress Notes (Signed)
Subjective:    Patient ID: Kevin Frazier, male    DOB: [birth year], 37 y.o.   MRN: [id number]  HPI  Here for wellness and f/u;  Overall doing ok;  Pt denies Chest pain, worsening SOB, DOE, wheezing, orthopnea, PND, worsening LE edema, palpitations, dizziness or syncope.  Pt denies neurological change such as new headache, facial or extremity weakness.  Pt denies polydipsia, polyuria, or low sugar symptoms. Pt states overall good compliance with treatment and medications, good tolerability, and has been trying to follow appropriate diet.  Pt denies worsening depressive symptoms, suicidal ideation or panic. No fever, night sweats, wt loss, loss of appetite, or other constitutional symptoms.  Pt states good ability with ADL's, has low fall risk, home safety reviewed and adequate, no other significant changes in hearing or vision, and only occasionally active with exercise. Also has recurrent LUQ pain, no different with any type of meal and not better with PPI , similar to previous eval per GI 2017, pt asking for referral as now thinking he will need egd/colonoscopy History reviewed. No pertinent past medical history. Past Surgical History:  Procedure Laterality Date  . none      reports that he has never smoked. He has never used smokeless tobacco. He reports that he drinks about 1.2 oz of alcohol per week. He reports that he does not use drugs. family history includes Pancreatic cancer in his paternal aunt. No Known Allergies No current outpatient medications on file prior to visit.   No current facility-administered medications on file prior to visit.    Review of Systems Constitutional: Negative for other unusual diaphoresis, sweats, appetite or weight changes HENT: Negative for other worsening hearing loss, ear pain, facial swelling, mouth sores or neck stiffness.   Eyes: Negative for other worsening pain, redness or other visual disturbance.  Respiratory: Negative for other stridor or  swelling Cardiovascular: Negative for other palpitations or other chest pain  Gastrointestinal: Negative for worsening diarrhea or loose stools, blood in stool, distention or other pain Genitourinary: Negative for hematuria, flank pain or other change in urine volume.  Musculoskeletal: Negative for myalgias or other joint swelling.  Skin: Negative for other color change, or other wound or worsening drainage.  Neurological: Negative for other syncope or numbness. Hematological: Negative for other adenopathy or swelling Psychiatric/Behavioral: Negative for hallucinations, other worsening agitation, SI, self-injury, or new decreased concentration All other system neg per pt    Objective:   Physical Exam BP 120/80 (BP Location: Left Arm, Patient Position: Sitting, Cuff Size: Normal)   Pulse (!) 51   Ht 5\' 9"  (1.753 m)   Wt 189 lb (85.7 kg)   SpO2 98%   BMI 27.91 kg/m  VS noted,  Constitutional: Pt is oriented to person, place, and time. Appears well-developed and well-nourished, in no significant distress and comfortable Head: Normocephalic and atraumatic  Eyes: Conjunctivae and EOM are normal. Pupils are equal, round, and reactive to light Right Ear: External ear normal without discharge Left Ear: External ear normal without discharge Nose: Nose without discharge or deformity Mouth/Throat: Oropharynx is without other ulcerations and moist  Neck: Normal range of motion. Neck supple. No JVD present. No tracheal deviation present or significant neck LA or mass Cardiovascular: Normal rate, regular rhythm, normal heart sounds and intact distal pulses.   Pulmonary/Chest: WOB normal and breath sounds without rales or wheezing  Abdominal: Soft. Bowel sounds are normal. NT. No HSM  Musculoskeletal: Normal range of motion. Exhibits no edema Lymphadenopathy: Has  no other cervical adenopathy.  Neurological: Pt is alert and oriented to person, place, and time. Pt has normal reflexes. No cranial  nerve deficit. Motor grossly intact, Gait intact Skin: Skin is warm and dry. No rash noted or new ulcerations Psychiatric:  Has normal mood and affect. Behavior is normal without agitation No other exam findings       Assessment & Plan:

## 2018-05-02 ENCOUNTER — Encounter: Payer: Self-pay | Admitting: Internal Medicine

## 2018-05-02 NOTE — Assessment & Plan Note (Signed)
Etiology unclear, cont same tx, declines bentyl trial, refer Gi

## 2018-05-02 NOTE — Assessment & Plan Note (Signed)

## 2018-05-28 ENCOUNTER — Ambulatory Visit (INDEPENDENT_AMBULATORY_CARE_PROVIDER_SITE_OTHER): Payer: BLUE CROSS/BLUE SHIELD | Admitting: Gastroenterology

## 2018-05-28 ENCOUNTER — Encounter: Payer: Self-pay | Admitting: Gastroenterology

## 2018-05-28 VITALS — BP 104/68 | HR 68 | Ht 69.0 in | Wt 188.0 lb

## 2018-05-28 DIAGNOSIS — R1012 Left upper quadrant pain: Secondary | ICD-10-CM | POA: Diagnosis not present

## 2018-05-28 MED ORDER — AMBULATORY NON FORMULARY MEDICATION: 0 refills | Status: DC

## 2018-05-28 NOTE — Progress Notes (Signed)
HPI :  37 y/o male here for a follow up visit. He was previously seen in 10/2016 for abdominal pain. At that time he had an H. Pylori stool test done which was negative. After that I had recommended a trial of omeprazole 40 mg once daily. He took this for a period of time and did not notice any significant benefit so he stopped it. We had previously discussed doing an endoscopy or CT scan if symptoms persisted, this is the first time I'm seeing him since 2017.  In general he states his symptoms have largely persisted, and are quite similar to how they bothered him in 2017. Overall symptoms have been ongoing since 2016 or so. He reports some discomfort in his left abdomen, describes as a pressure. This appears located in the LUQ / rub cage area and sometimes into the left back. Discomfort comes and goes, is not constant. He reports he will feel it every day. Eating can often precipitate the symptom at times. He thinks symptoms last a few hours and goes away on its own. No nausea or vomiting. No nocturnal symptoms. No weight loss. He denies any reflux symptoms. No dysphagia. He lifts weights and this does not seem to produce the pain or make it worse. He denies any routine NSAID use. No prior upper endoscopy or abdominal imaging. He's had a prior normal CXR. He's recently had labwork done which was normal. No problems with his bowels presently. Aunt had pancreatic cancer.     History reviewed. No pertinent past medical history.   Past Surgical History:  Procedure Laterality Date  . none     Family History  Problem Relation Age of Onset  . Pancreatic cancer Paternal Aunt   . Diabetes Neg Hx   . Heart disease Neg Hx   . Hyperlipidemia Neg Hx   . Hypertension Neg Hx   . Kidney disease Neg Hx   . Colon cancer Neg Hx   . Stomach cancer Neg Hx   . Esophageal cancer Neg Hx   . Rectal cancer Neg Hx   . Liver cancer Neg Hx    Social History   Tobacco Use  . Smoking status: Never Smoker  .  Smokeless tobacco: Never Used  Substance Use Topics  . Alcohol use: Yes    Alcohol/week: 1.2 oz    Types: 2 Shots of liquor per week    Comment: occassional  . Drug use: No   No current outpatient medications on file.   No current facility-administered medications for this visit.    No Known Allergies   Review of Systems: All systems reviewed and negative except where noted in HPI.   Lab Results  Component Value Date   WBC 5.2 04/30/2018   HGB 14.1 04/30/2018   HCT 40.9 04/30/2018   MCV 88.0 04/30/2018   PLT 189.0 04/30/2018    Lab Results  Component Value Date   CREATININE 0.88 04/30/2018   BUN 15 04/30/2018   NA 140 04/30/2018   K 4.0 04/30/2018   CL 105 04/30/2018   CO2 26 04/30/2018    Lab Results  Component Value Date   ALT 12 04/30/2018   AST 13 04/30/2018   ALKPHOS 26 (L) 04/30/2018   BILITOT 0.4 04/30/2018   Lab Results  Component Value Date   LIPASE 10.0 (L) 04/30/2018     Physical Exam: BP 104/68 (BP Location: Left Arm, Patient Position: Sitting, Cuff Size: Normal)   Pulse 68   Ht 5'  9" (1.753 m) Comment: height measured without shoes  Wt 188 lb (85.3 kg)   BMI 27.76 kg/m  Constitutional: Pleasant,well-developed, male in no acute distress. HEENT: Normocephalic and atraumatic. Conjunctivae are normal. No scleral icterus. Neck supple.  Cardiovascular: Normal rate, regular rhythm.  Pulmonary/chest: Effort normal and breath sounds normal. No wheezing, rales or rhonchi. Abdominal: Soft, nondistended, nontender - unable to reproduce symptoms with palpation. There are no masses palpable. No hepatomegaly. Extremities: no edema Lymphadenopathy: No cervical adenopathy noted. Neurological: Alert and oriented to person place and time. Skin: Skin is warm and dry. No rashes noted. Psychiatric: Normal mood and affect. Behavior is normal.   ASSESSMENT AND PLAN: 37 year old male here for follow-up visit to discuss issue as outlined:  Abdominal pain -  described as above. Etiology unclear. So far negative for H. Pylori and no improvement with PPI. He does have some postprandial discomfort however location is upper left abdomen over rib cage extending into left back. We discussed ddx and options to further evaluate this, namely cross-sectional imaging versus endoscopy. Given the location of where his symptoms are will start with a CT scan with contrast. If the CT scan is negative will then proceed with an upper endoscopy. We discussed what both of these would entail. Further recommendations pitting results of the CT scan. In the interim I gave him much free trial of FD Delene Ruffini from our clinic to see if that helps at all. He agreed with the plan.  Ileene Patrick, MD [facility]

## 2018-05-28 NOTE — Patient Instructions (Signed)
If you are age 37 or older, your body mass index should be between 23-30. Your Body mass index is 27.76 kg/m. If this is out of the aforementioned range listed, please consider follow up with your Primary Care Provider.  If you are age 82 or younger, your body mass index should be between 19-25. Your Body mass index is 27.76 kg/m. If this is out of the aformentioned range listed, please consider follow up with your Primary Care Provider.   You have been scheduled for a CT scan of the abdomen and pelvis at [facility] ([address]---this is in the same building as Press [facility]).   You are scheduled on Friday, July 12th at 1:30pm. You should arrive 15 minutes prior to your appointment time for registration. Please follow the written instructions below on the day of your exam:  WARNING: IF YOU ARE ALLERGIC TO IODINE/X-RAY DYE, PLEASE NOTIFY RADIOLOGY IMMEDIATELY AT [phone]! YOU WILL BE GIVEN A 13 HOUR PREMEDICATION PREP.  1) Do not eat after 9:30am  (4 hours prior to your test) 2) You have been given 2 bottles of oral contrast to drink. The solution may taste better if refrigerated, but do NOT add ice or any other liquid to this solution. Shake well before drinking.    Drink 1 bottle of contrast @ 11:30am (2 hours prior to your exam)  Drink 1 bottle of contrast @ 12:30pm (1 hour prior to your exam)  You may take any medications as prescribed with a small amount of water except for the following: Metformin, Glucophage, Glucovance, Avandamet, Riomet, Fortamet, Actoplus Met, Janumet, Glumetza or Metaglip. The above medications must be held the day of the exam AND 48 hours after the exam.  The purpose of you drinking the oral contrast is to aid in the visualization of your intestinal tract. The contrast solution may cause some diarrhea. Before your exam is started, you will be given a small amount of fluid to drink. Depending on your individual set of symptoms, you may also  receive an intravenous injection of x-ray contrast/dye. Plan on being at [facility] for 30 minutes or longer, depending on the type of exam you are having performed.  This test typically takes 30-45 minutes to complete.  If you have any questions regarding your exam or if you need to reschedule, you may call the CT department at [phone] between the hours of 8:00 am and 5:00 pm, Monday-Friday.  ________________________________________________________________________  We are giving you samples of FDgard today.  Take as directed.  Thank you for entrusting me with your care and for choosing [facility], Dr. [place] Cellar

## 2018-05-31 ENCOUNTER — Telehealth: Payer: Self-pay

## 2018-05-31 NOTE — Telephone Encounter (Signed)
-----   Message from Libby MawAmy L Hazelwood sent at 05/31/2018  7:25 AM EDT ----- Regarding: order for CT Hey Jan, This pt has a CT abd/pel scheduled.  According to note and dx code, he is having upper abd pain.  There is nothing in the note to support ordering the pelvis.  Can you verify if maybe he might just want to order the abd? Thanks, Amy

## 2018-05-31 NOTE — Telephone Encounter (Signed)
Clarified Dr. Mervyn SkeetersA wants CT of Abdomen only.  [facility]ed [facility] at [facility] and changed to Abdomen only. Stacy corrected the order.

## 2018-06-04 ENCOUNTER — Ambulatory Visit (INDEPENDENT_AMBULATORY_CARE_PROVIDER_SITE_OTHER)
Admission: RE | Admit: 2018-06-04 | Discharge: 2018-06-04 | Disposition: A | Discharge status: Home / Self Care | Payer: BLUE CROSS/BLUE SHIELD | Source: Ambulatory Visit | Attending: Gastroenterology | Admitting: Gastroenterology

## 2018-06-04 DIAGNOSIS — R1012 Left upper quadrant pain: Secondary | ICD-10-CM

## 2018-06-04 MED ORDER — IOPAMIDOL (ISOVUE-300) INJECTION 61%
80.0000 mL | Freq: Once | INTRAVENOUS | Status: AC | PRN
  Administered 2018-06-04: 80 mL via INTRAVENOUS

## 2018-06-08 ENCOUNTER — Other Ambulatory Visit: Payer: Self-pay

## 2018-06-08 ENCOUNTER — Ambulatory Visit (AMBULATORY_SURGERY_CENTER): Payer: Self-pay | Admitting: *Deleted

## 2018-06-08 VITALS — Ht 69.0 in | Wt 187.2 lb

## 2018-06-08 DIAGNOSIS — R1012 Left upper quadrant pain: Secondary | ICD-10-CM

## 2018-06-08 NOTE — Progress Notes (Signed)
No egg or soy allergy known to patient  No issues with past sedation with any surgeries  or procedures, no intubation problems  No diet pills per patient No home 02 use per patient  No blood thinners per patient  Pt denies issues with constipation  No A fib or A flutter  EMMI video sent to pt's e mail  

## 2018-06-09 ENCOUNTER — Encounter: Payer: Self-pay | Admitting: Gastroenterology

## 2018-06-10 ENCOUNTER — Ambulatory Visit (AMBULATORY_SURGERY_CENTER): Payer: BLUE CROSS/BLUE SHIELD | Admitting: Gastroenterology

## 2018-06-10 ENCOUNTER — Encounter: Payer: Self-pay | Admitting: Gastroenterology

## 2018-06-10 ENCOUNTER — Other Ambulatory Visit: Payer: Self-pay

## 2018-06-10 VITALS — BP 108/56 | HR 78 | Temp 98.6°F | Resp 17 | Ht 69.0 in | Wt 187.0 lb

## 2018-06-10 DIAGNOSIS — R1013 Epigastric pain: Secondary | ICD-10-CM | POA: Diagnosis not present

## 2018-06-10 DIAGNOSIS — K317 Polyp of stomach and duodenum: Secondary | ICD-10-CM | POA: Diagnosis not present

## 2018-06-10 DIAGNOSIS — K219 Gastro-esophageal reflux disease without esophagitis: Secondary | ICD-10-CM | POA: Diagnosis not present

## 2018-06-10 DIAGNOSIS — R1012 Left upper quadrant pain: Secondary | ICD-10-CM

## 2018-06-10 DIAGNOSIS — K3189 Other diseases of stomach and duodenum: Secondary | ICD-10-CM | POA: Diagnosis not present

## 2018-06-10 MED ORDER — SODIUM CHLORIDE 0.9 % IV SOLN: 500.0000 mL | Freq: Once | INTRAVENOUS | Status: DC

## 2018-06-10 NOTE — Patient Instructions (Signed)
Impression/Recommendations:  Hiatal hernia handout given to patient.  Resume previous diet. Continue present medications.  Await pathology results.  YOU HAD AN ENDOSCOPIC PROCEDURE TODAY AT THE [facility]:   Refer to the procedure report that was given to you for any specific questions about what was found during the examination.  If the procedure report does not answer your questions, please call your gastroenterologist to clarify.  If you requested that your care partner not be given the details of your procedure findings, then the procedure report has been included in a sealed envelope for you to review at your convenience later.  YOU SHOULD EXPECT: Some feelings of bloating in the abdomen. Passage of more gas than usual.  Walking can help get rid of the air that was put into your GI tract during the procedure and reduce the bloating. If you had a lower endoscopy (such as a colonoscopy or flexible sigmoidoscopy) you may notice spotting of blood in your stool or on the toilet paper. If you underwent a bowel prep for your procedure, you may not have a normal bowel movement for a few days.  Please Note:  You might notice some irritation and congestion in your nose or some drainage.  This is from the oxygen used during your procedure.  There is no need for concern and it should clear up in a day or so.  SYMPTOMS TO REPORT IMMEDIATELY:  Following upper endoscopy (EGD)  Vomiting of blood or coffee ground material  New chest pain or pain under the shoulder blades  Painful or persistently difficult swallowing  New shortness of breath  Fever of 100F or higher  Black, tarry-looking stools  For urgent or emergent issues, a gastroenterologist can be reached at any hour by calling [phone].   DIET:  We do recommend a small meal at first, but then you may proceed to your regular diet.  Drink plenty of fluids but you should avoid alcoholic beverages for 24 hours.  ACTIVITY:   You should plan to take it easy for the rest of today and you should NOT DRIVE or use heavy machinery until tomorrow (because of the sedation medicines used during the test).    FOLLOW UP: Our staff will call the number listed on your records the next business day following your procedure to check on you and address any questions or concerns that you may have regarding the information given to you following your procedure. If we do not reach you, we will leave a message.  However, if you are feeling well and you are not experiencing any problems, there is no need to return our call.  We will assume that you have returned to your regular daily activities without incident.  If any biopsies were taken you will be contacted by phone or by letter within the next 1-3 weeks.  Please call us at [phone] if you have not heard about the biopsies in 3 weeks.    SIGNATURES/CONFIDENTIALITY: You and/or your care partner have signed paperwork which will be entered into your electronic medical record.  These signatures attest to the fact that that the information above on your After Visit Summary has been reviewed and is understood.  Full responsibility of the confidentiality of this discharge information lies with you and/or your care-partner.

## 2018-06-10 NOTE — Progress Notes (Signed)
Called to room to assist during endoscopic procedure.  Patient ID and intended procedure confirmed with present staff. Received instructions for my participation in the procedure from the performing physician.  

## 2018-06-10 NOTE — Progress Notes (Signed)
A and O x3. Report to RN. Tolerated MAC anesthesia well.Teeth unchanged after procedure.

## 2018-06-11 NOTE — Op Note (Signed)
[facility] Patient Name: Kevin Frazier Procedure Date: 06/10/2018 4:27 PM MRN: [id number] Endoscopist: Viviann Spare P. Adela Lank , MD Age: 37 Referring MD:  Date of Birth: Sep 08, 1981 Gender: Male Account #: [id number] Procedure:                Upper GI endoscopy Indications:              Abdominal pain in the left upper quadrant,                            Dyspepsia, negative CT scan Medicines:                Monitored Anesthesia Care Procedure:                Pre-Anesthesia Assessment:                           - Prior to the procedure, a History and Physical                            was performed, and patient medications and                            allergies were reviewed. The patient's tolerance of                            previous anesthesia was also reviewed. The risks                            and benefits of the procedure and the sedation                            options and risks were discussed with the patient.                            All questions were answered, and informed consent                            was obtained. Prior Anticoagulants: The patient has                            taken no previous anticoagulant or antiplatelet                            agents. ASA Grade Assessment: II - A patient with                            mild systemic disease. After reviewing the risks                            and benefits, the patient was deemed in                            satisfactory condition to undergo the procedure.  After obtaining informed consent, the endoscope was                            passed under direct vision. Throughout the                            procedure, the patient's blood pressure, pulse, and                            oxygen saturations were monitored continuously. The                            Endoscope was introduced through the mouth, and                            advanced to the second part of  duodenum. The upper                            GI endoscopy was accomplished without difficulty.                            The patient tolerated the procedure well. Scope In: Scope Out: Findings:                 Esophagogastric landmarks were identified: the                            Z-line was found at 38 cm, the gastroesophageal                            junction was found at 38 cm and the upper extent of                            the gastric folds was found at 39 cm from the                            incisors.                           A 1 cm hiatal hernia was present.                           The exam of the esophagus was otherwise normal.                           A single 3 to 4 mm sessile polyp was found in the                            gastric body. The polyp was removed with a cold                            biopsy forceps. Resection and retrieval were  complete.                           The exam of the stomach was otherwise normal.                           Biopsies were taken with a cold forceps in the                            gastric body, at the incisura and in the gastric                            antrum for Helicobacter pylori testing.                           The duodenal bulb and second portion of the                            duodenum were normal. Complications:            No immediate complications. Estimated blood loss:                            Minimal. Estimated Blood Loss:     Estimated blood loss was minimal. Impression:               - Esophagogastric landmarks identified.                           - 1 cm hiatal hernia.                           - Normal esophagus otherwise                           - A single gastric polyp. Resected and retrieved.                           - Normal stomach otherwise, biopsies taken to rule                            out H pylori                           - Normal duodenal bulb and second  portion of the                            duodenum. Recommendation:           - Patient has a contact number available for                            emergencies. The signs and symptoms of potential                            delayed complications were discussed with the  patient. Return to normal activities tomorrow.                            Written discharge instructions were provided to the                            patient.                           - Resume previous diet.                           - Continue present medications.                           - Await pathology results. If positive for H pylori                            will treat. If negative, consider other regimens to                            treat dyspepsia Willaim RayasSteven P. Mayson Mcneish, MD 06/10/2018 4:59:25 PM This report has been signed electronically.

## 2018-06-14 ENCOUNTER — Telehealth: Payer: Self-pay

## 2018-06-14 ENCOUNTER — Telehealth: Payer: Self-pay | Admitting: *Deleted

## 2018-06-14 NOTE — Telephone Encounter (Signed)
Tried to leave message, mailbox is full

## 2018-06-14 NOTE — Telephone Encounter (Signed)
  Follow up Call-  Call back number 06/10/2018  Post procedure Call Back phone  # [phone]  Permission to leave phone message Yes  Some recent data might be hidden     No answer at # given.  VM box full.  Unable to leave a message.  Will attempt to call later.

## 2018-06-25 ENCOUNTER — Other Ambulatory Visit: Payer: Self-pay | Admitting: Gastroenterology

## 2018-06-25 DIAGNOSIS — R1013 Epigastric pain: Secondary | ICD-10-CM

## 2018-06-25 MED ORDER — BUSPIRONE HCL 7.5 MG PO TABS: 7.5000 mg | ORAL_TABLET | Freq: Two times a day (BID) | ORAL | 3 refills | Status: DC

## 2019-02-11 ENCOUNTER — Other Ambulatory Visit: Payer: Self-pay

## 2019-02-11 ENCOUNTER — Encounter: Payer: Self-pay | Admitting: Internal Medicine

## 2019-02-11 ENCOUNTER — Ambulatory Visit (INDEPENDENT_AMBULATORY_CARE_PROVIDER_SITE_OTHER): Payer: BLUE CROSS/BLUE SHIELD | Admitting: Internal Medicine

## 2019-02-11 VITALS — BP 122/78 | HR 64 | Temp 98.1°F | Ht 69.0 in | Wt 186.0 lb

## 2019-02-11 DIAGNOSIS — F419 Anxiety disorder, unspecified: Secondary | ICD-10-CM | POA: Diagnosis not present

## 2019-02-11 DIAGNOSIS — Z23 Encounter for immunization: Secondary | ICD-10-CM | POA: Diagnosis not present

## 2019-02-11 DIAGNOSIS — Z0000 Encounter for general adult medical examination without abnormal findings: Secondary | ICD-10-CM | POA: Diagnosis not present

## 2019-02-11 DIAGNOSIS — E785 Hyperlipidemia, unspecified: Secondary | ICD-10-CM

## 2019-02-11 DIAGNOSIS — K30 Functional dyspepsia: Secondary | ICD-10-CM | POA: Insufficient documentation

## 2019-02-11 HISTORY — DX: Hyperlipidemia, unspecified: E78.5

## 2019-02-11 NOTE — Patient Instructions (Signed)
Please continue all other medications as before, and refills have been done if requested.  Please have the pharmacy call with any other refills you may need.  Please continue your efforts at being more active, low cholesterol diet, and weight control.  You are otherwise up to date with prevention measures today.  Please keep your appointments with your specialists as you may have planned  Please return in 1 year for your yearly visit, or sooner if needed, with Lab testing done 3-5 days before  

## 2019-02-11 NOTE — Progress Notes (Signed)
Subjective:    Patient ID: Kevin Frazier, male    DOB: 1981-05-12, 38 y.o.   MRN: [id number]  HPI    Here for wellness and f/u;  Overall doing ok;  Pt denies Chest pain, worsening SOB, DOE, wheezing, orthopnea, PND, worsening LE edema, palpitations, dizziness or syncope.  Pt denies neurological change such as new headache, facial or extremity weakness.  Pt denies polydipsia, polyuria, or low sugar symptoms. Pt states overall good compliance with treatment and medications, good tolerability, and has been trying to follow appropriate diet.  Pt denies worsening depressive symptoms, suicidal ideation or panic, though has ongoing marked work stress, considering to change jobs. No fever, night sweats, wt loss, loss of appetite, or other constitutional symptoms.  Pt states good ability with ADL's, has low fall risk, home safety reviewed and adequate, no other significant changes in hearing or vision, and only occasionally active with exercise.  Due for flu shot. No past medical history on file. Past Surgical History:  Procedure Laterality Date  . FOOT SURGERY     as child  . wisdom teeth      reports that he has never smoked. He has never used smokeless tobacco. He reports current alcohol use of about 2.0 standard drinks of alcohol per week. He reports that he does not use drugs. family history includes Pancreatic cancer in his paternal aunt. No Known Allergies No current outpatient medications on file prior to visit.   Current Facility-Administered Medications on File Prior to Visit  Medication Dose Route Frequency Provider Last Rate Last Dose  . 0.9 %  sodium chloride infusion  500 mL Intravenous Once Armbruster, Willaim Rayas, MD       Review of Systems Constitutional: Negative for other unusual diaphoresis, sweats, appetite or weight changes HENT: Negative for other worsening hearing loss, ear pain, facial swelling, mouth sores or neck stiffness.   Eyes: Negative for other worsening pain, redness  or other visual disturbance.  Respiratory: Negative for other stridor or swelling Cardiovascular: Negative for other palpitations or other chest pain  Gastrointestinal: Negative for worsening diarrhea or loose stools, blood in stool, distention or other pain Genitourinary: Negative for hematuria, flank pain or other change in urine volume.  Musculoskeletal: Negative for myalgias or other joint swelling.  Skin: Negative for other color change, or other wound or worsening drainage.  Neurological: Negative for other syncope or numbness. Hematological: Negative for other adenopathy or swelling Psychiatric/Behavioral: Negative for hallucinations, other worsening agitation, SI, self-injury, or new decreased concentration All other system neg per pt    Objective:   Physical Exam BP 122/78   Pulse 64   Temp 98.1 F (36.7 C) (Oral)   Ht 5\' 9"  (1.753 m)   Wt 186 lb (84.4 kg)   SpO2 98%   BMI 27.47 kg/m  VS noted,  Constitutional: Pt is oriented to person, place, and time. Appears well-developed and well-nourished, in no significant distress and comfortable Head: Normocephalic and atraumatic  Eyes: Conjunctivae and EOM are normal. Pupils are equal, round, and reactive to light Right Ear: External ear normal without discharge Left Ear: External ear normal without discharge Nose: Nose without discharge or deformity Mouth/Throat: Oropharynx is without other ulcerations and moist  Neck: Normal range of motion. Neck supple. No JVD present. No tracheal deviation present or significant neck LA or mass Cardiovascular: Normal rate, regular rhythm, normal heart sounds and intact distal pulses.   Pulmonary/Chest: WOB normal and breath sounds without rales or wheezing  Abdominal:  Soft. Bowel sounds are normal. NT. No HSM  Musculoskeletal: Normal range of motion. Exhibits no edema Lymphadenopathy: Has no other cervical adenopathy.  Neurological: Pt is alert and oriented to person, place, and time. Pt  has normal reflexes. No cranial nerve deficit. Motor grossly intact, Gait intact Skin: Skin is warm and dry. No rash noted or new ulcerations Psychiatric:  Has nervous mood and affect. Behavior is normal without agitation No other exam findings Lab Results  Component Value Date   WBC 5.2 04/30/2018   HGB 14.1 04/30/2018   HCT 40.9 04/30/2018   PLT 189.0 04/30/2018   GLUCOSE 97 04/30/2018   CHOL 167 04/30/2018   TRIG 70.0 04/30/2018   HDL 33.90 (L) 04/30/2018   LDLCALC 119 (H) 04/30/2018   ALT 12 04/30/2018   AST 13 04/30/2018   NA 140 04/30/2018   K 4.0 04/30/2018   CL 105 04/30/2018   CREATININE 0.88 04/30/2018   BUN 15 04/30/2018   CO2 26 04/30/2018   TSH 1.76 04/30/2018       Assessment & Plan:

## 2019-02-11 NOTE — Assessment & Plan Note (Signed)

## 2019-02-11 NOTE — Assessment & Plan Note (Signed)
Mild, for lower chol diet 

## 2019-02-11 NOTE — Assessment & Plan Note (Signed)
Did not tolerate the buspar, he will call if would want celexa 10 qd

## 2019-08-13 IMAGING — CT CT ABDOMEN W/ CM
2 of 5 series · 16 of 46 positions shown, 18 images · IV contrast (ISOVUE 300)
Comparison: None.

CLINICAL DATA: Chronic left upper quadrant pain.

EXAM:
CT ABDOMEN WITH CONTRAST
TECHNIQUE: Multidetector CT imaging of the abdomen was performed using the
standard protocol following bolus administration of intravenous
contrast.
CONTRAST:  80mL 73ZCY1-K00 IOPAMIDOL (73ZCY1-K00) INJECTION 61%

[Series 2: abdomen w 5.0 i40f 2 · axial · 0.73mm/px · z∈[+1084,+1274]mm · 13 of 44 slices shown, 15 images]
[im 3/44  soft-tissue]
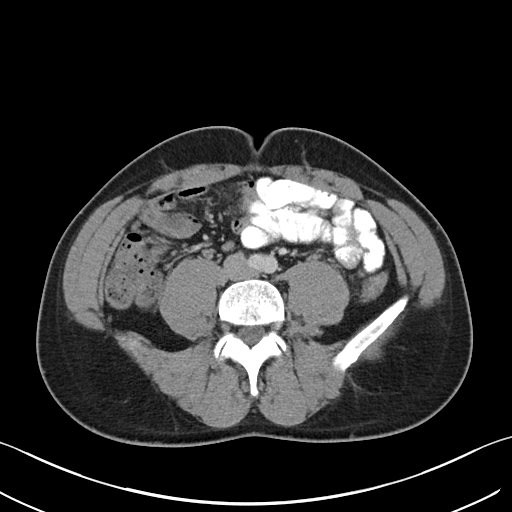
[im 3/44  bone]
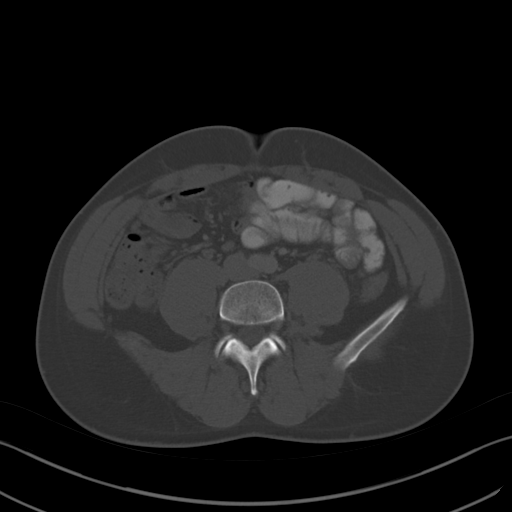
[im 6/44  soft-tissue]
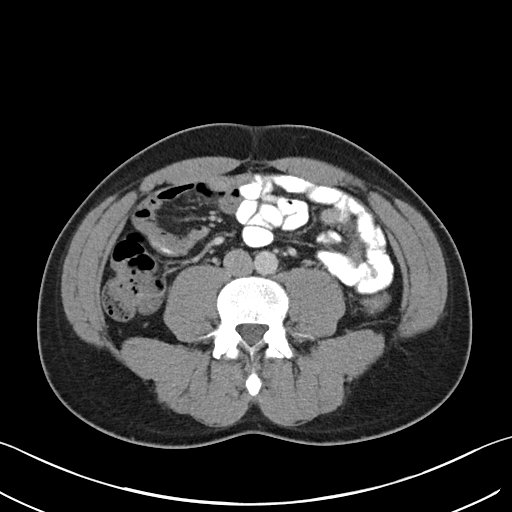
[im 9/44  soft-tissue]
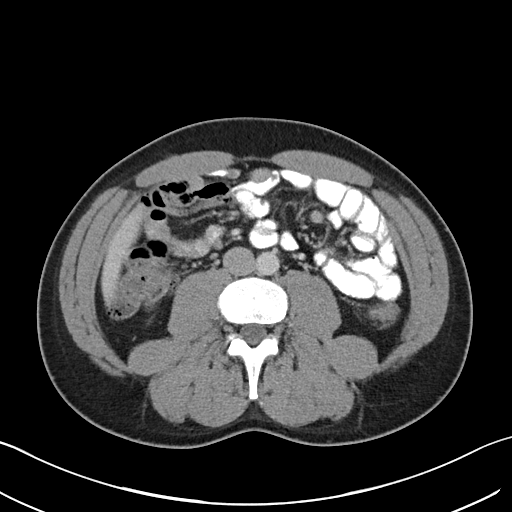
[im 12/44  soft-tissue]
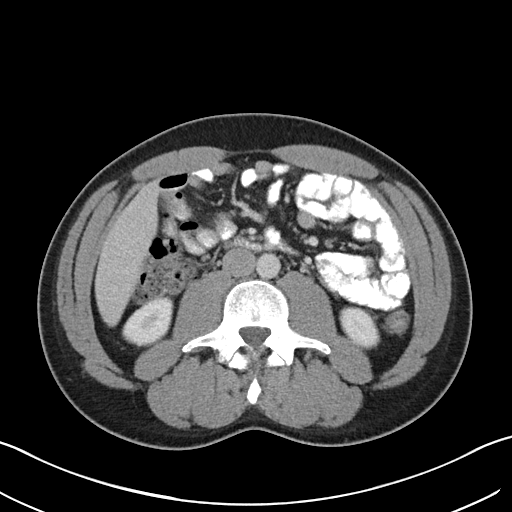
[im 15/44  soft-tissue]
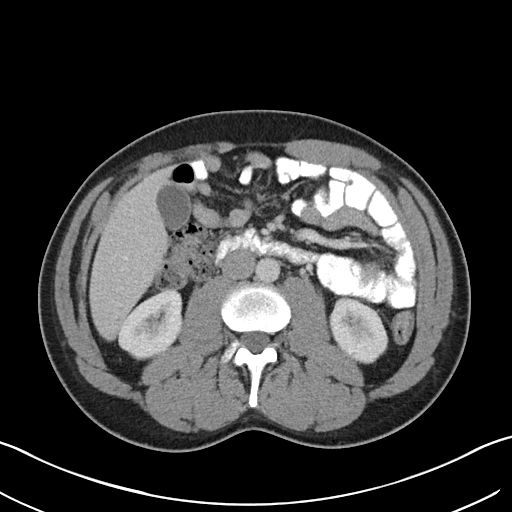
[im 18/44  soft-tissue]
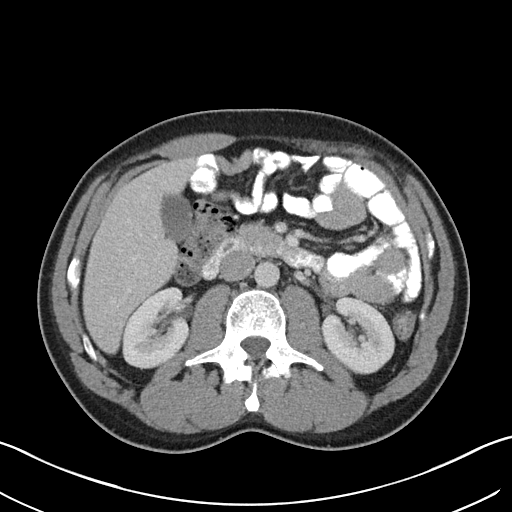
[im 23/44  soft-tissue]
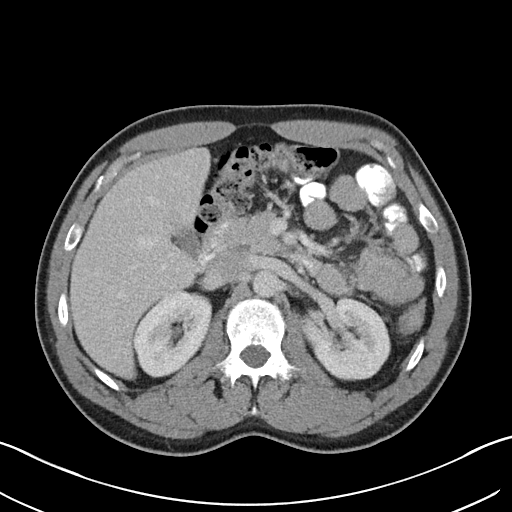
[im 26/44  soft-tissue]
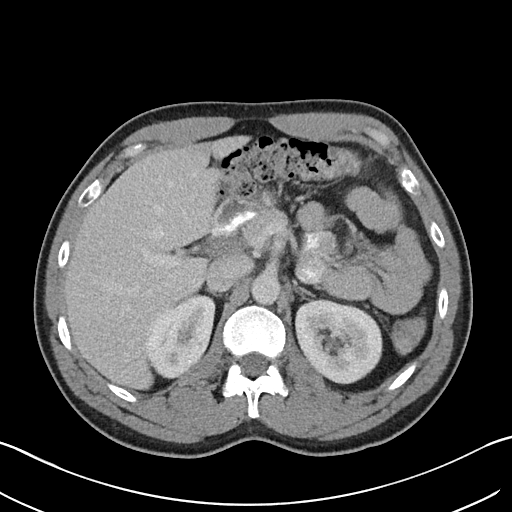
[im 29/44  soft-tissue]
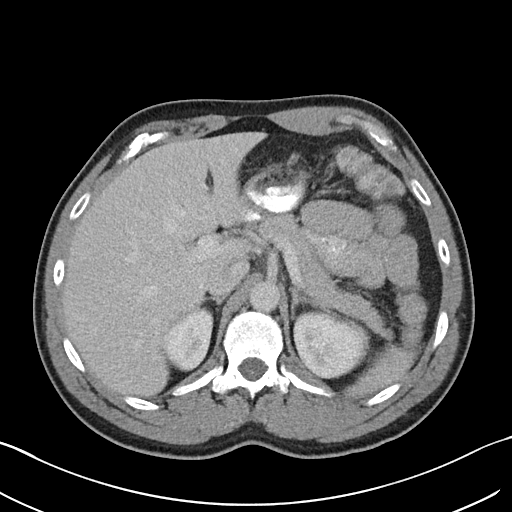
[im 29/44  bone]
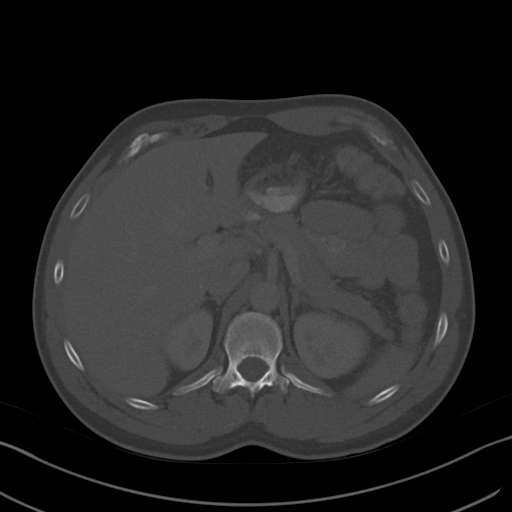
[im 32/44  soft-tissue]
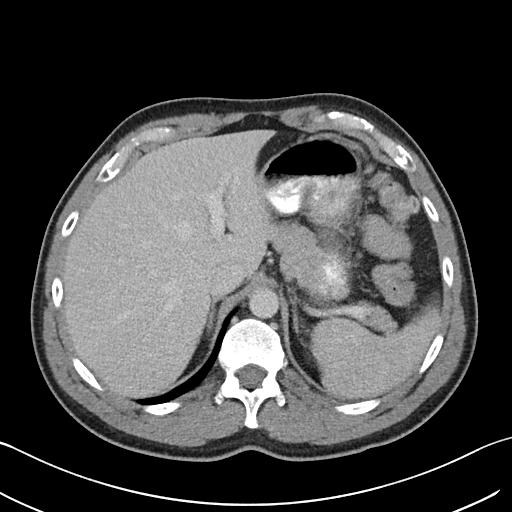
[im 35/44  soft-tissue]
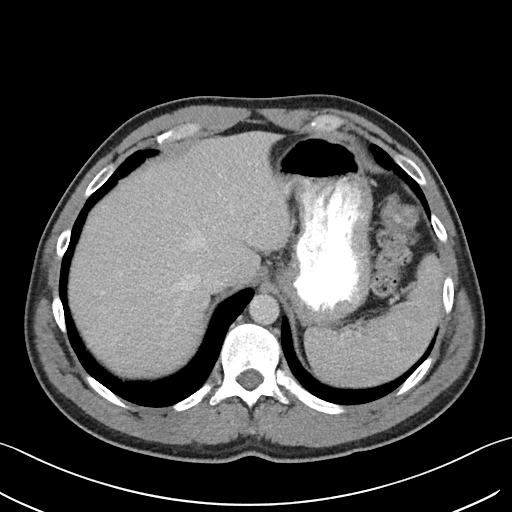
[im 38/44  soft-tissue]
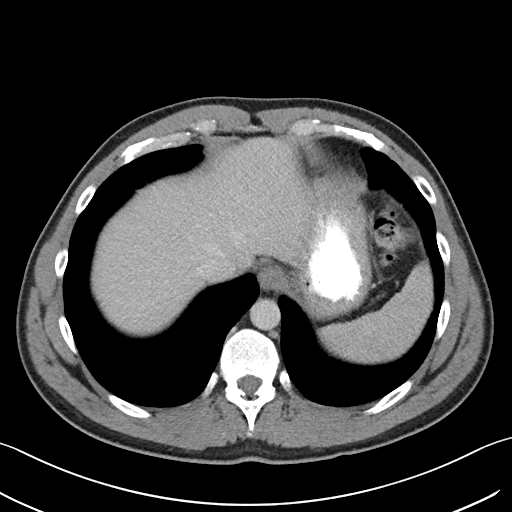
[im 41/44  soft-tissue]
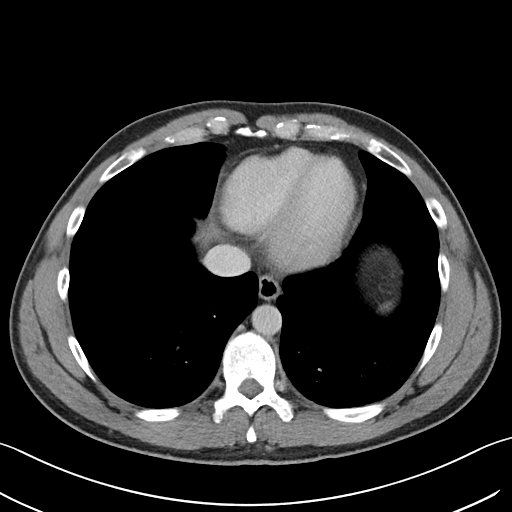

[Series 5: coronal · coronal · 0.52mm/px · 3 of 87 slices shown]
[im 29/87  soft-tissue]
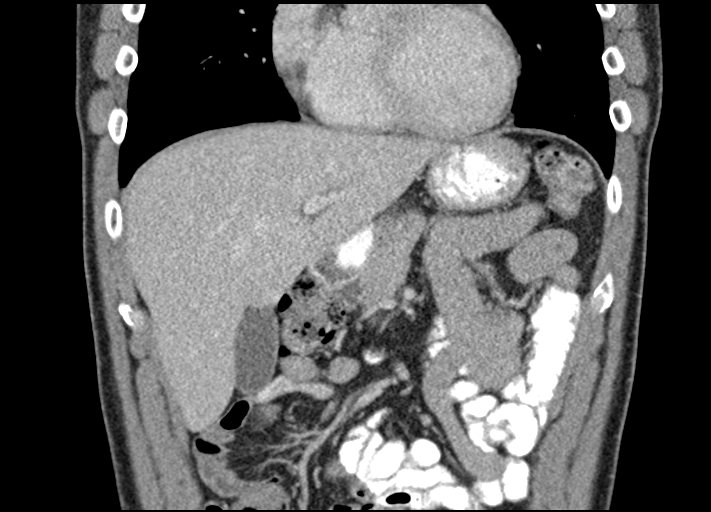
[im 39/87  soft-tissue]
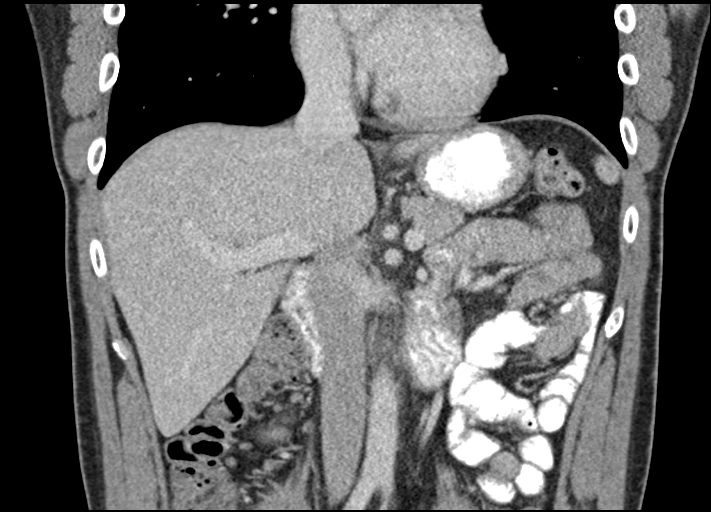
[im 48/87  soft-tissue]
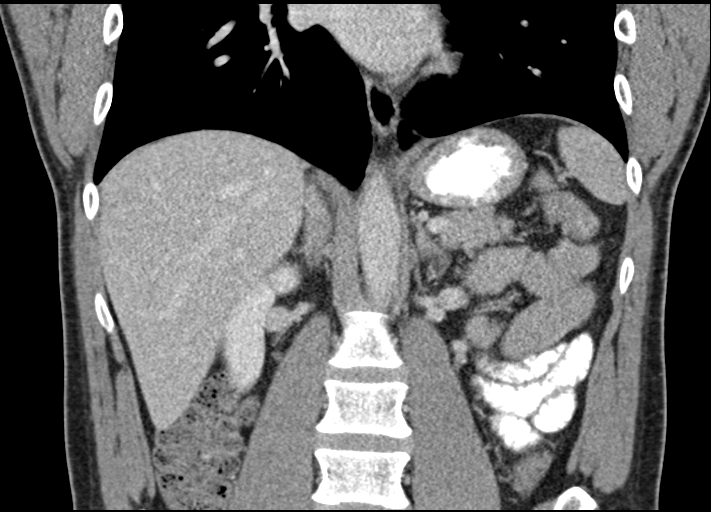

[16 of 46 positions shown; findings below may reference images not displayed]

FINDINGS: Lower chest: Unremarkable.

Hepatobiliary: No focal abnormality within the liver parenchyma.
There is no evidence for gallstones, gallbladder wall thickening, or
pericholecystic fluid. No intrahepatic or extrahepatic biliary
dilation.

Pancreas: No focal mass lesion. No dilatation of the main duct. No
intraparenchymal cyst. No peripancreatic edema.

Spleen: No splenomegaly. No focal mass lesion.

Adrenals/Urinary Tract: No adrenal nodule or mass. Kidneys
unremarkable. No evidence for hydronephrosis.

Stomach/Bowel: Stomach is nondistended. No gastric wall thickening.
No evidence of outlet obstruction. Duodenum is normally positioned
as is the ligament of Treitz. Small bowel loops and visualize
colonic segments of the abdomen are unremarkable.

Vascular/Lymphatic: No abdominal aortic aneurysm. No abdominal
aortic atherosclerotic calcification. There is no gastrohepatic or
hepatoduodenal ligament lymphadenopathy. No intraperitoneal or
retroperitoneal lymphadenopathy.

Other: No intraperitoneal free fluid.

Musculoskeletal: No worrisome lytic or sclerotic osseous
abnormality.
IMPRESSION: 1. Normal CT evaluation of the abdomen. No findings to explain the
patient's history of left-sided pain.

## 2019-12-06 ENCOUNTER — Ambulatory Visit (INDEPENDENT_AMBULATORY_CARE_PROVIDER_SITE_OTHER): Payer: BC Managed Care – PPO | Admitting: Internal Medicine

## 2019-12-06 ENCOUNTER — Other Ambulatory Visit: Payer: Self-pay

## 2019-12-06 ENCOUNTER — Encounter: Payer: Self-pay | Admitting: Internal Medicine

## 2019-12-06 VITALS — BP 106/72 | HR 66 | Temp 98.1°F | Ht 69.0 in | Wt 194.6 lb

## 2019-12-06 DIAGNOSIS — N481 Balanitis: Secondary | ICD-10-CM | POA: Diagnosis not present

## 2019-12-06 DIAGNOSIS — Z0000 Encounter for general adult medical examination without abnormal findings: Secondary | ICD-10-CM

## 2019-12-06 DIAGNOSIS — Z23 Encounter for immunization: Secondary | ICD-10-CM

## 2019-12-06 MED ORDER — CLOTRIMAZOLE-BETAMETHASONE 1-0.05 % EX CREA: TOPICAL_CREAM | CUTANEOUS | 1 refills | Status: AC

## 2019-12-06 NOTE — Assessment & Plan Note (Signed)
Mild to mod, for lotrisone cream prn,  to f/u any worsening symptoms or concerns

## 2019-12-06 NOTE — Progress Notes (Signed)
Subjective:    Patient ID: Kevin Frazier, male    DOB: 22-Aug-1981, 39 y.o.   MRN: [id number]  HPI here with acute onset 1 mo rash to glans penis, started as small area left side, now mostly encircles and involves the whole glans penis, with some itchiness and off white discoloration but no pain, fever, d/c, ulcer or other rash.  Nothing seems to make better or worse, even holding off on intercourse.  Pt denies chest pain, increased sob or doe, wheezing, orthopnea, PND, increased LE swelling, palpitations, dizziness or syncope.  Has had also some right hip and post left leg pain and plans to see sports medicine Past Medical History:  Diagnosis Date  . HLD (hyperlipidemia) 02/11/2019   Past Surgical History:  Procedure Laterality Date  . FOOT SURGERY     as child  . wisdom teeth      reports that he has never smoked. He has never used smokeless tobacco. He reports current alcohol use of about 2.0 standard drinks of alcohol per week. He reports that he does not use drugs. family history includes Pancreatic cancer in his paternal aunt. No Known Allergies No current outpatient medications on file prior to visit.   Current Facility-Administered Medications on File Prior to Visit  Medication Dose Route Frequency Provider Last Rate Last Admin  . 0.9 %  sodium chloride infusion  500 mL Intravenous Once Armbruster, Carlota Raspberry, MD       Review of Systems  Constitutional: Negative for other unusual diaphoresis or sweats HENT: Negative for ear discharge or swelling Eyes: Negative for other worsening visual disturbances Respiratory: Negative for stridor or other swelling  Gastrointestinal: Negative for worsening distension or other blood Genitourinary: Negative for retention or other urinary change Musculoskeletal: Negative for other MSK pain or swelling Skin: Negative for color change or other new lesions Neurological: Negative for worsening tremors and other numbness  Psychiatric/Behavioral:  Negative for worsening agitation or other fatigue All otherwise neg per pt     Objective:   Physical Exam BP 106/72   Pulse 66   Temp 98.1 F (36.7 C)   Ht 5\' 9"  (1.753 m)   Wt 194 lb 9.6 oz (88.3 kg)   SpO2 99%   BMI 28.74 kg/m  VS noted,  Constitutional: Pt appears in NAD HENT: Head: NCAT.  Right Ear: External ear normal.  Left Ear: External ear normal.  Eyes: . Pupils are equal, round, and reactive to light. Conjunctivae and EOM are normal Nose: without d/c or deformity Neck: Neck supple. Gross normal ROM Cardiovascular: Normal rate and regular rhythm.   Pulmonary/Chest: Effort normal and breath sounds without rales or wheezing.  Abd:  Soft, NT, ND, + BS, no organomegaly Neurological: Pt is alert. At baseline orientation, motor grossly intact Skin: Skin is warm. No other new lesions, no LE edema, GU: glans penis with silvery off white rash to whole glans penis and patchy areas just proximal on dorsal aspect as well, without swelling, tender, redness, ulcer or penile d/c Psychiatric: Pt behavior is normal without agitation  All otherwise neg per pt Lab Results  Component Value Date   WBC 5.2 04/30/2018   HGB 14.1 04/30/2018   HCT 40.9 04/30/2018   PLT 189.0 04/30/2018   GLUCOSE 97 04/30/2018   CHOL 167 04/30/2018   TRIG 70.0 04/30/2018   HDL 33.90 (L) 04/30/2018   LDLCALC 119 (H) 04/30/2018   ALT 12 04/30/2018   AST 13 04/30/2018   NA 140 04/30/2018  K 4.0 04/30/2018   CL 105 04/30/2018   CREATININE 0.88 04/30/2018   BUN 15 04/30/2018   CO2 26 04/30/2018   TSH 1.76 04/30/2018         Assessment & Plan:

## 2019-12-06 NOTE — Patient Instructions (Signed)
You had the flu shot today  You have what appears to be balanitis (fungal infection) - Please take all new medication as prescribed - the cream  Please stop at the first floor for an appt with Sports Medicine for the right hip and left leg pain  Please continue all other medications as before, and refills have been done if requested.  Please have the pharmacy call with any other refills you may need.  Please keep your appointments with your specialists as you may have planned

## 2019-12-06 NOTE — Addendum Note (Signed)
Addended by: Milus Mallick on: 12/06/2019 04:42 PM   Modules accepted: Orders

## 2019-12-09 ENCOUNTER — Other Ambulatory Visit: Payer: Self-pay

## 2019-12-09 ENCOUNTER — Ambulatory Visit (INDEPENDENT_AMBULATORY_CARE_PROVIDER_SITE_OTHER): Payer: BC Managed Care – PPO | Admitting: Family Medicine

## 2019-12-09 ENCOUNTER — Encounter: Payer: Self-pay | Admitting: Family Medicine

## 2019-12-09 VITALS — BP 100/70 | HR 68 | Ht 69.0 in | Wt 194.0 lb

## 2019-12-09 DIAGNOSIS — M76892 Other specified enthesopathies of left lower limb, excluding foot: Secondary | ICD-10-CM

## 2019-12-09 DIAGNOSIS — M7061 Trochanteric bursitis, right hip: Secondary | ICD-10-CM | POA: Diagnosis not present

## 2019-12-09 NOTE — Progress Notes (Signed)
Subjective:    I'm seeing this patient as a consultation for:  Dr. Jenny Reichmann. Note will be routed back to referring provider/PCP.  CC: R hip and L leg pain  I, Kana Thompson, LAT, ATC, am serving as scribe for Dr. Lynne Leader.  HPI: Pt is a 39 y/o male presenting w/ c/o R hip.  R hip:  Pt rates his R hip pain at a 7/10 that is chronic w/ no known MOI.  He locates the pain the R lateral hip/glute and describes it as sharp pain when laying on his R side.  He has not tried any treatments to date.  He denies any radiating pain or numbness/tingling into the R leg. Home exercises on his own to improve his strength and notes that he has a little bit of pain with these as well although he does not have much pain with his normal life activities outside of laying on his right side.  He notes that he does keep his large thick wallet in his right back pocket and thinks that may be a factor here as well.  He has not tried moving the wall yet.  Additionally he notes some mild left posterior thigh pain that he attributes to hamstring strain.  He notes this has been an ongoing for few weeks and is improving with relative rest and some stretching.  He cannot think of any injury that might have caused this either.    Past medical history, Surgical history, Family history, Social history, Allergies, and medications have been entered into the medical record, reviewed.   Review of Systems: No new headache, visual changes, nausea, vomiting, diarrhea, constipation, dizziness, abdominal pain, skin rash, fevers, chills, night sweats, weight loss, swollen lymph nodes, body aches, joint swelling, muscle aches, chest pain, shortness of breath, mood changes, visual or auditory hallucinations.   Objective:    Vitals:   12/09/19 0957  BP: 100/70  Pulse: 68  SpO2: 94%   General: Well Developed, well nourished, and in no acute distress.  Neuro/Psych: Alert and oriented x3, extra-ocular muscles intact, able to move all 4  extremities, sensation grossly intact. Skin: Warm and dry, no rashes noted.  Respiratory: Not using accessory muscles, speaking in full sentences, trachea midline.  Cardiovascular: Pulses palpable, no extremity edema. Abdomen: Does not appear distended. MSK:  Right hip: Normal-appearing normal motion. Tender palpation greater trochanter. Hip abduction strength diminished 4/5 pain with resisted hip abduction. Hip external rotation strength slightly diminished 4+/5.  Again pain with resisted hip external rotation.   Hip internal rotation and adduction 5/5 and nonpainful.  Left hip: Normal-appearing normal motion.  Nontender including at ischial tuberosity. Hip abduction strength 5/5.  External rotation strength 5/5.  Internal rotation and adduction 5/5. Positive H test.    Impression and Recommendations:    Assessment and Plan: 39 y.o. male with right hip pain. 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  May have a component of hip external rotator tendinopathy as well.  Large wallet may be a factor but not fully to billing here.  Plan to treat with home exercise program as taught by ATC in clinic today. If not improving patient will let me know and we will proceed with physical therapy. Recommend also moving wallet to front pocket.  Left posterior thigh pain secondary to hamstring strain.  Plan to treat with home exercise program including [place] protocol.  PDMP not reviewed this encounter. No orders of the defined types were placed in this encounter.  No orders of the defined types were placed in this encounter.   Discussed warning signs or symptoms. Please see discharge instructions. Patient expresses understanding.  The above documentation has been reviewed and is accurate and complete Clementeen Graham

## 2019-12-09 NOTE — Patient Instructions (Addendum)
Thank you for coming in today. I think the main issue in your right hip is trochanteric bursitis.  We will proceed with home exercises.  Let me know if not improving in a few weeks. I will order PT.   I think you also are recovering from a hamstring strain in your left leg.   The [place] L Protocol for Hamstring Strains  Leeroy Cha PT  Please perform the exercise program that we have prepared for you and gone over in detail on a daily basis.  In addition to the handout you were provided you can access your program through: [email or web address]   Your unique program code is: [email or web address]   Keep me updated.  Recheck if not improving or if worsening.  Move the wallet to the front pocket.

## 2020-01-09 ENCOUNTER — Ambulatory Visit: Payer: BC Managed Care – PPO | Attending: Internal Medicine

## 2020-01-09 DIAGNOSIS — Z20822 Contact with and (suspected) exposure to covid-19: Secondary | ICD-10-CM

## 2020-01-10 LAB — NOVEL CORONAVIRUS, NAA: SARS-CoV-2, NAA: DETECTED — AB

## 2020-01-16 ENCOUNTER — Telehealth: Payer: Self-pay | Admitting: Internal Medicine

## 2020-01-16 NOTE — Telephone Encounter (Signed)
Patient is calling and states he tested positive for covid on 02/16 and is stating he is having some very bad congestion and would like to know if a antibiotic can be given. Please advise.

## 2020-01-16 NOTE — Telephone Encounter (Signed)
Very sorry, there are no "regular" antibiotics like you would normally think of for the COVID.  The only antibiotic treatment is the remdesivir which is only usually given if you require to be in hospital, thanks

## 2020-01-17 ENCOUNTER — Telehealth: Payer: Self-pay

## 2020-01-17 NOTE — Telephone Encounter (Signed)
Called and spoke to patient about message voiced understanding

## 2020-02-17 ENCOUNTER — Other Ambulatory Visit: Payer: Self-pay | Admitting: Internal Medicine

## 2020-02-17 ENCOUNTER — Encounter: Payer: Self-pay | Admitting: Internal Medicine

## 2020-02-17 ENCOUNTER — Other Ambulatory Visit: Payer: Self-pay

## 2020-02-17 ENCOUNTER — Ambulatory Visit (INDEPENDENT_AMBULATORY_CARE_PROVIDER_SITE_OTHER): Payer: BC Managed Care – PPO | Admitting: Internal Medicine

## 2020-02-17 VITALS — BP 140/70 | HR 72 | Temp 98.2°F | Ht 69.0 in | Wt 192.6 lb

## 2020-02-17 DIAGNOSIS — E785 Hyperlipidemia, unspecified: Secondary | ICD-10-CM

## 2020-02-17 DIAGNOSIS — N481 Balanitis: Secondary | ICD-10-CM

## 2020-02-17 DIAGNOSIS — E559 Vitamin D deficiency, unspecified: Secondary | ICD-10-CM | POA: Diagnosis not present

## 2020-02-17 DIAGNOSIS — E611 Iron deficiency: Secondary | ICD-10-CM | POA: Diagnosis not present

## 2020-02-17 DIAGNOSIS — F419 Anxiety disorder, unspecified: Secondary | ICD-10-CM

## 2020-02-17 DIAGNOSIS — R1012 Left upper quadrant pain: Secondary | ICD-10-CM

## 2020-02-17 DIAGNOSIS — Z114 Encounter for screening for human immunodeficiency virus [HIV]: Secondary | ICD-10-CM | POA: Diagnosis not present

## 2020-02-17 DIAGNOSIS — U071 COVID-19: Secondary | ICD-10-CM | POA: Insufficient documentation

## 2020-02-17 DIAGNOSIS — E538 Deficiency of other specified B group vitamins: Secondary | ICD-10-CM | POA: Diagnosis not present

## 2020-02-17 LAB — IBC PANEL
Iron: 135 ug/dL (ref 42–165)
Saturation Ratios: 48.7 % (ref 20.0–50.0)
Transferrin: 198 mg/dL — ABNORMAL LOW (ref 212.0–360.0)

## 2020-02-17 LAB — VITAMIN D 25 HYDROXY (VIT D DEFICIENCY, FRACTURES): VITD: 15.04 ng/mL — ABNORMAL LOW (ref 30.00–100.00)

## 2020-02-17 LAB — VITAMIN B12: Vitamin B-12: 132 pg/mL — ABNORMAL LOW (ref 211–911)

## 2020-02-17 MED ORDER — VITAMIN D (ERGOCALCIFEROL) 1.25 MG (50000 UNIT) PO CAPS: 50000.0000 [IU] | ORAL_CAPSULE | ORAL | 0 refills | Status: AC

## 2020-02-17 MED ORDER — VITAMIN B-12 1000 MCG PO TABS: 1000.0000 ug | ORAL_TABLET | Freq: Every day | ORAL | 3 refills | Status: AC

## 2020-02-17 NOTE — Assessment & Plan Note (Signed)
Resolved,  to f/u any worsening symptoms or concerns  

## 2020-02-17 NOTE — Progress Notes (Signed)
   Subjective:    Patient ID: Kevin Frazier, male    DOB: 12-04-1980, 39 y.o.   MRN: [id number]  HPI  Here to f/u; overall doing ok,  Pt denies chest pain, increasing sob or doe, wheezing, orthopnea, PND, increased LE swelling, palpitations, dizziness or syncope.  Pt denies new neurological symptoms such as new headache, or facial or extremity weakness or numbness.  Pt denies polydipsia, polyuria, or low sugar episode.  Pt states overall good compliance with meds, mostly trying to follow appropriate diet, with wt overall stable,  but little exercise however.  Seen in Jan 2021 with balanits, had covid infection about 2 wks later, still with persistent low smell and taste. BP Readings from Last 3 Encounters:  02/17/20 140/70  12/09/19 100/70  12/06/19 106/72   Past Medical History:  Diagnosis Date  . HLD (hyperlipidemia) 02/11/2019   Past Surgical History:  Procedure Laterality Date  . FOOT SURGERY     as child  . wisdom teeth      reports that he has never smoked. He has never used smokeless tobacco. He reports current alcohol use of about 2.0 standard drinks of alcohol per week. He reports that he does not use drugs. family history includes Pancreatic cancer in his paternal aunt. No Known Allergies Current Outpatient Medications on File Prior to Visit  Medication Sig Dispense Refill  . clotrimazole-betamethasone (LOTRISONE) cream Use as directed twice daily as needed 15 g 1   No current facility-administered medications on file prior to visit.   Review of Systems All otherwise neg per pt     Objective:   Physical Exam BP 140/70   Pulse 72   Temp 98.2 F (36.8 C)   Ht 5\' 9"  (1.753 m)   Wt 192 lb 9.6 oz (87.4 kg)   SpO2 100%   BMI 28.44 kg/m  VS noted,  Constitutional: Pt appears in NAD HENT: Head: NCAT.  Right Ear: External ear normal.  Left Ear: External ear normal.  Eyes: . Pupils are equal, round, and reactive to light. Conjunctivae and EOM are normal Nose: without  d/c or deformity Neck: Neck supple. Gross normal ROM Cardiovascular: Normal rate and regular rhythm.   Pulmonary/Chest: Effort normal and breath sounds without rales or wheezing.  Abd:  Soft, NT, ND, + BS, no organomegaly Neurological: Pt is alert. At baseline orientation, motor grossly intact Skin: Skin is warm. No rashes, other new lesions, no LE edema Psychiatric: Pt behavior is normal without agitation  All otherwise neg per pt Lab Results  Component Value Date   WBC 5.2 04/30/2018   HGB 14.1 04/30/2018   HCT 40.9 04/30/2018   PLT 189.0 04/30/2018   GLUCOSE 97 04/30/2018   CHOL 167 04/30/2018   TRIG 70.0 04/30/2018   HDL 33.90 (L) 04/30/2018   LDLCALC 119 (H) 04/30/2018   ALT 12 04/30/2018   AST 13 04/30/2018   NA 140 04/30/2018   K 4.0 04/30/2018   CL 105 04/30/2018   CREATININE 0.88 04/30/2018   BUN 15 04/30/2018   CO2 26 04/30/2018   TSH 1.76 04/30/2018      Assessment & Plan:

## 2020-02-17 NOTE — Assessment & Plan Note (Addendum)
With mod to severe persistent loss of taste, smell - reassured,  to f/u any worsening symptoms or concerns  I spent 35 minutes in preparing to see the patient by review of recent labs, imaging and procedures, obtaining and reviewing separately obtained history, communicating with the patient and family or caregiver, ordering medications, tests or procedures, and documenting clinical information in the EHR including the differential Dx, treatment, and any further evaluation and other management of covid infection, hlD, anxiety, lUQ pain, balanitis,

## 2020-02-17 NOTE — Patient Instructions (Signed)
In April 2021, please go online to [email or web address] to schedule your appt for the Vaccine in May 2021  Please continue all other medications as before, and refills have been done if requested.  Please have the pharmacy call with any other refills you may need.  Please continue your efforts at being more active, low cholesterol diet, and weight control.  You are otherwise up to date with prevention measures today.  Please keep your appointments with your specialists as you may have planned  Please go to the LAB at the blood drawing area for the tests to be done  You will be contacted by phone if any changes need to be made immediately.  Otherwise, you will receive a letter about your results with an explanation, but please check with MyChart first.  Please remember to sign up for MyChart if you have not done so, as this will be important to you in the future with finding out test results, communicating by private email, and scheduling acute appointments online when needed.  Please make an Appointment to return for your 1 year visit, or sooner if needed

## 2020-02-17 NOTE — Assessment & Plan Note (Signed)
Most c/w msk vs neuritic, mild  Cont to follow

## 2020-02-18 LAB — HIV ANTIBODY (ROUTINE TESTING W REFLEX): HIV 1&2 Ab, 4th Generation: NONREACTIVE

## 2020-02-19 ENCOUNTER — Encounter: Payer: Self-pay | Admitting: Internal Medicine

## 2020-02-19 NOTE — Assessment & Plan Note (Signed)
stable overall by history and exam, recent data reviewed with pt, and pt to continue medical treatment as before,  to f/u any worsening symptoms or concerns  

## 2020-02-24 ENCOUNTER — Ambulatory Visit: Payer: BC Managed Care – PPO | Attending: Internal Medicine

## 2020-02-24 DIAGNOSIS — Z23 Encounter for immunization: Secondary | ICD-10-CM

## 2020-02-24 NOTE — Progress Notes (Signed)
   Covid-19 Vaccination Clinic  Name:  Kevin Frazier    MRN: [id number] DOB: 1981/04/28  02/24/2020  Kevin Frazier was observed post Covid-19 immunization for 15 minutes without incident. He was provided with Vaccine Information Sheet and instruction to access the V-Safe system.   Kevin Frazier was instructed to call 911 with any severe reactions post vaccine: Marland Kitchen Difficulty breathing  . Swelling of face and throat  . A fast heartbeat  . A bad rash all over body  . Dizziness and weakness   Immunizations Administered    Name Date Dose VIS Date Route   Pfizer COVID-19 Vaccine 02/24/2020  8:22 AM 0.3 mL 11/04/2019 Intramuscular   Manufacturer: [employer], [employer]   Lot: [id number]   NDC: [address]-9

## 2020-03-19 ENCOUNTER — Ambulatory Visit: Payer: BC Managed Care – PPO | Attending: Internal Medicine

## 2020-03-19 DIAGNOSIS — Z23 Encounter for immunization: Secondary | ICD-10-CM

## 2020-03-19 NOTE — Progress Notes (Signed)
   Covid-19 Vaccination Clinic  Name:  Kevin Frazier    MRN: [id number] DOB: 1981/06/10  03/19/2020  [name] was observed post Covid-19 immunization for 15 minutes without incident. He was provided with Vaccine Information Sheet and instruction to access the V-Safe system.   [name] was instructed to call 911 with any severe reactions post vaccine: Marland Kitchen Difficulty breathing  . Swelling of face and throat  . A fast heartbeat  . A bad rash all over body  . Dizziness and weakness   Immunizations Administered    Name Date Dose VIS Date Route   Pfizer COVID-19 Vaccine 03/19/2020 11:22 AM 0.3 mL 01/18/2019 Intramuscular   Manufacturer: [employer], [employer]   Lot: [id number]   NDC: [address]-6

## 2020-07-16 ENCOUNTER — Other Ambulatory Visit (HOSPITAL_COMMUNITY): Payer: Self-pay | Admitting: Occupational Medicine

## 2020-07-16 ENCOUNTER — Other Ambulatory Visit: Payer: Self-pay

## 2020-07-16 ENCOUNTER — Ambulatory Visit (HOSPITAL_COMMUNITY)
Admission: RE | Admit: 2020-07-16 | Discharge: 2020-07-16 | Disposition: A | Discharge status: Home / Self Care | Payer: BC Managed Care – PPO | Source: Ambulatory Visit | Attending: Occupational Medicine | Admitting: Occupational Medicine

## 2020-07-16 DIAGNOSIS — R7612 Nonspecific reaction to cell mediated immunity measurement of gamma interferon antigen response without active tuberculosis: Secondary | ICD-10-CM
